# Patient Record
Sex: Female | Born: 1975 | Race: Black or African American | Hispanic: No | Marital: Single | Smoking: Never smoker
Health system: Southern US, Academic
[De-identification: ages and names within clinical notes are randomized; demographics above are authoritative.]

---
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
## 2021-08-04 IMAGING — MR MRI LUMBAR SPINE WITHOUT CONTRAST
6 series · 40 of 48 positions shown · IV contrast (gadolinium)
Comparison: None available.
COMPARISON: None available.

------------- REPORT [id number] -------------
﻿EXAM:  [id number]   MRI LUMBAR SPINE WITHOUT CONTRAST
INDICATION: Lower back pain.
TECHNIQUE: Multiplanar multisequential MRI of the lumbar spine was performed without gadolinium contrast.

[Series 5: T2 · sagittal · 4.0mm · 0.94mm/px · 5 of 13 slices shown (1 of 3)]
[im 1/13]
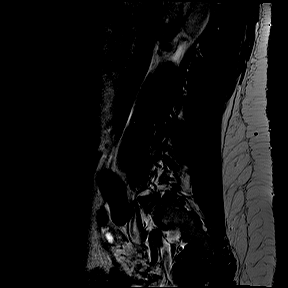
[im 4/13]
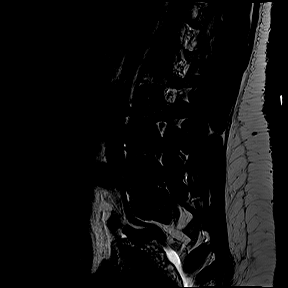
[im 7/13]
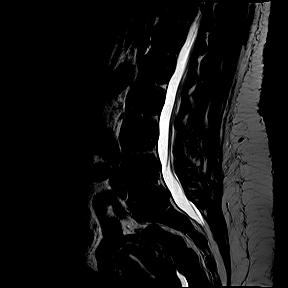
[im 10/13]
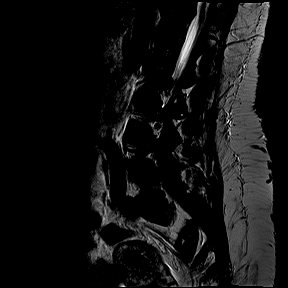
[im 13/13]
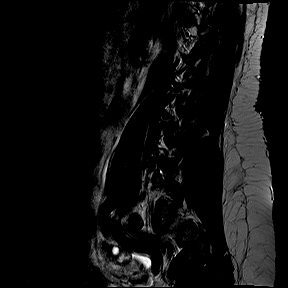

[Series 6: T1 · sagittal · 4.0mm · 0.94mm/px · 6 of 13 slices shown (1 of 2)]
[im 1/13]
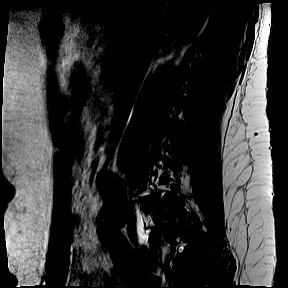
[im 3/13]
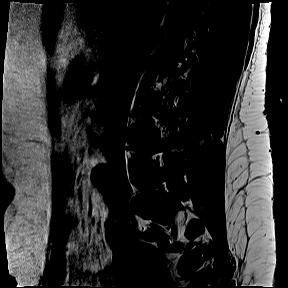
[im 5/13]
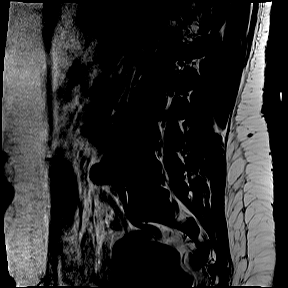
[im 8/13]
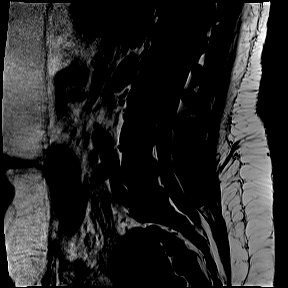
[im 10/13]
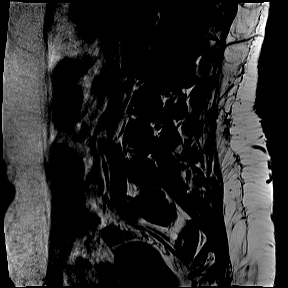
[im 13/13]
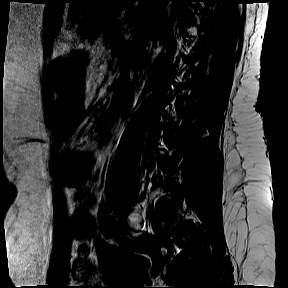

[Series 7: STIR · sagittal · 4.0mm · 1.05mm/px · 1 of 13 slices shown]
[im 1/13]
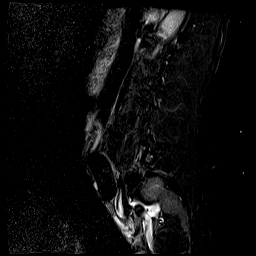

[Series 8: T2 · axial · 4.0mm · 0.47mm/px · z∈[-90,+103]mm · 11 of 23 slices shown (2 of 3)]
[im 1/23]
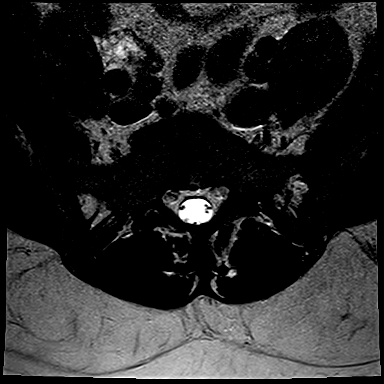
[im 3/23]
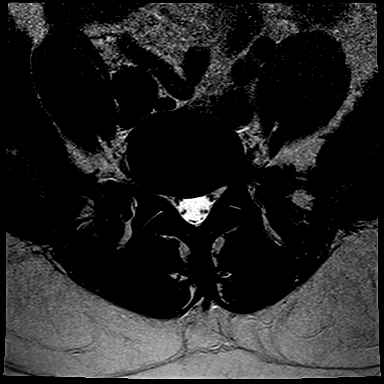
[im 5/23]
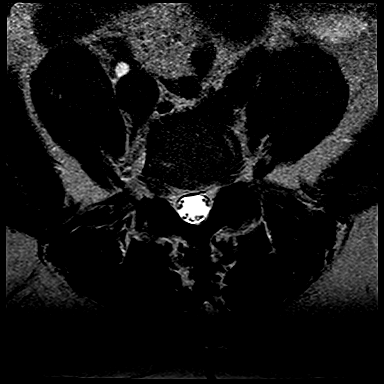
[im 7/23]
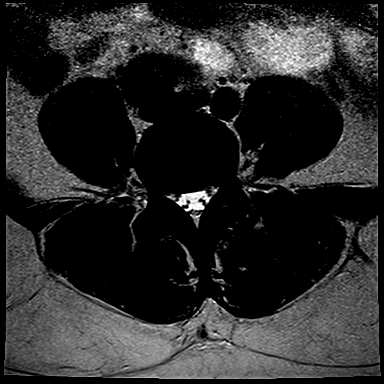
[im 9/23]
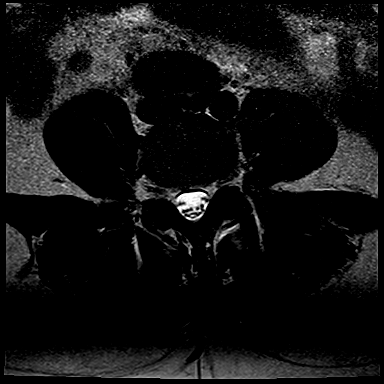
[im 12/23]
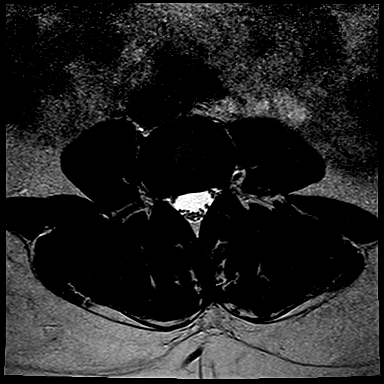
[im 14/23]
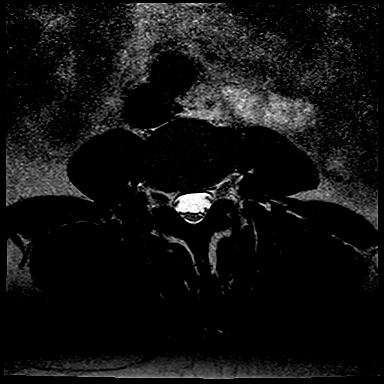
[im 16/23]
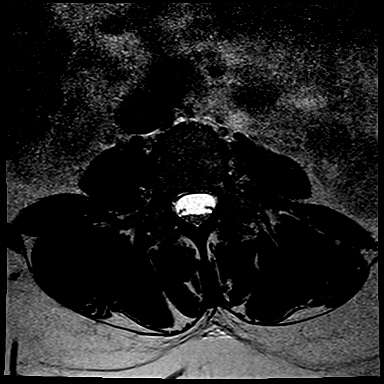
[im 18/23]
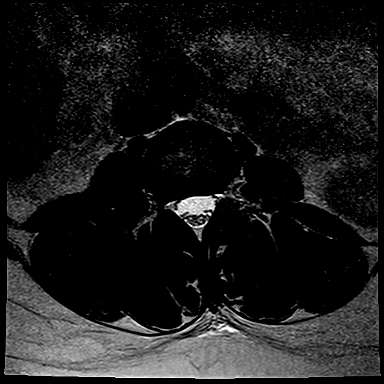
[im 20/23]
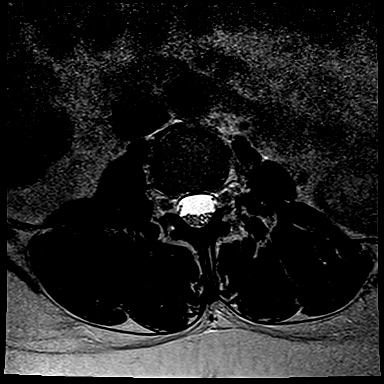
[im 23/23]
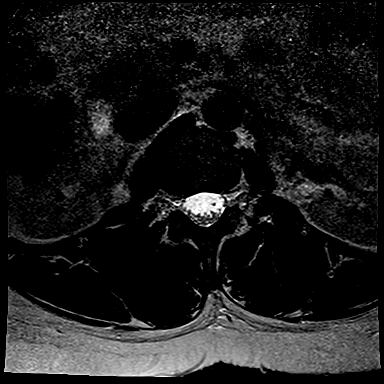

[Series 9: T1 · axial · 4.0mm · 0.47mm/px · z∈[-90,+103]mm · 8 of 23 slices shown (2 of 2)]
[im 1/23]
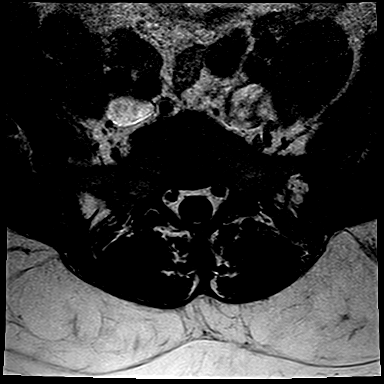
[im 5/23]
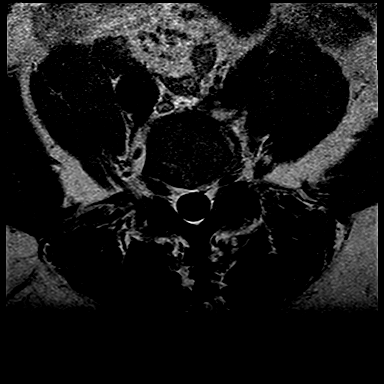
[im 7/23]
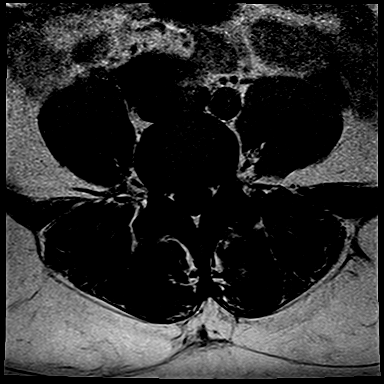
[im 9/23]
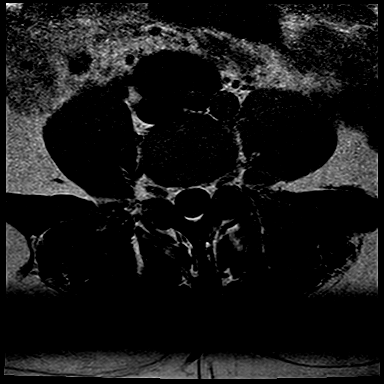
[im 14/23]
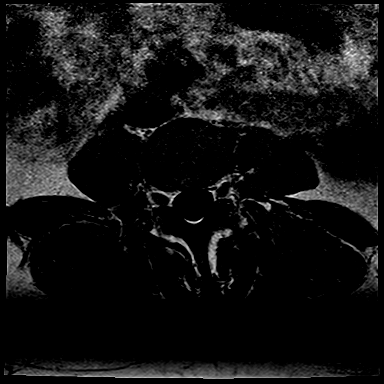
[im 16/23]
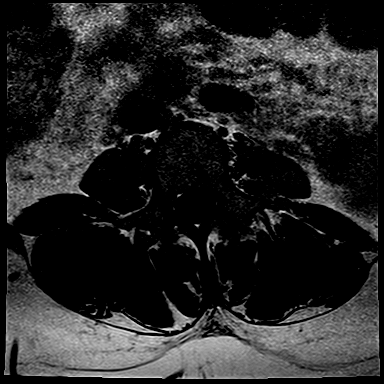
[im 18/23]
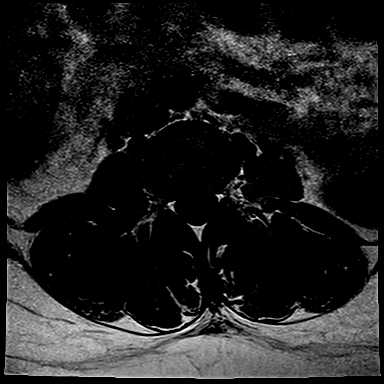
[im 23/23]
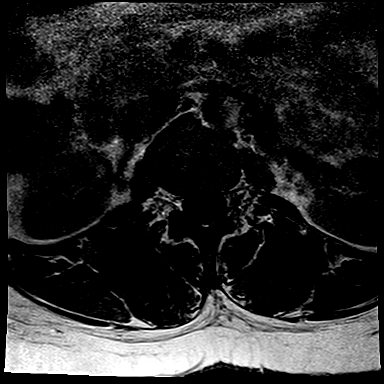

[Series 10: T2 · coronal · 4.0mm · 1.18mm/px · 9 of 20 slices shown (3 of 3)]
[im 1/20]
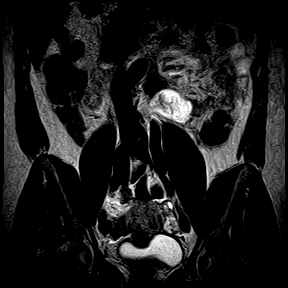
[im 3/20]
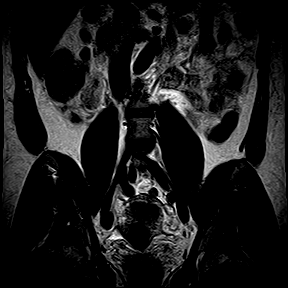
[im 5/20]
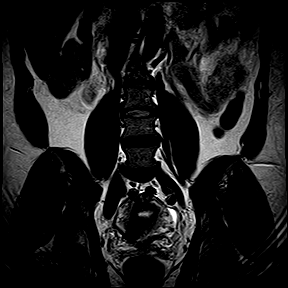
[im 8/20]
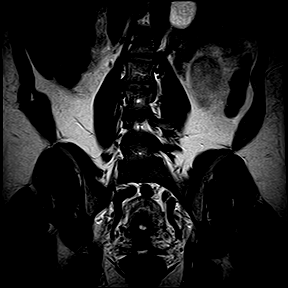
[im 10/20]
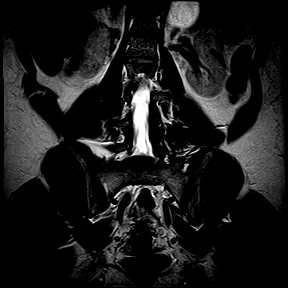
[im 12/20]
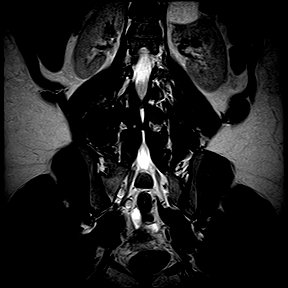
[im 15/20]
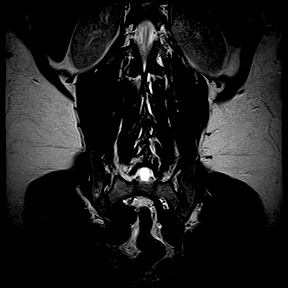
[im 17/20]
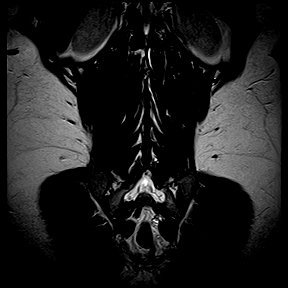
[im 20/20]
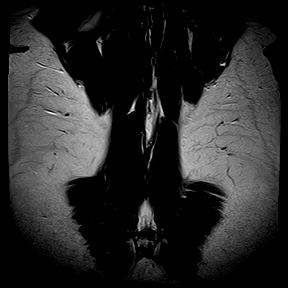

[40 of 48 positions shown; findings below may reference images not displayed]

FINDINGS: Vertebral bodies are normal in height, alignment and signal intensity.  There is no acute fracture or subluxation. Distal spinal cord is normal in signal intensity and terminates normally at T12-L1 disc space level. Spinal canal is congenitally narrow.

L1-2 and L2-3 levels are unremarkable. 

At L3-4 level, there is a minimal bulging annulus, minimally effacing the ventral thecal sac. There is mild right neural foraminal stenosis from bulging annulus without nerve root impingement. 

At L4-5 level, there is a small broad-based central disc bulge, mildly effacing the ventral thecal sac.  There is moderate bilateral neural foraminal stenosis from facet arthropathy and bulging annulus. 

At L5-S1 level, there is a small broad-based central disc bulge, mildly effacing the ventral thecal sac. There is moderate bilateral neural foraminal stenosis from facet arthropathy and bulging annulus.

Paraspinal soft tissues are unremarkable.
IMPRESSION: 1. No significant disc herniation or spinal stenosis at any level. 

2. Multilevel neural foraminal stenosis as detailed above.

------------- REPORT [id number] -------------
EXAM:  [id number]   MRI LUMBAR SPINE WITHOUT CONTRAST
FINDINGS: Vertebral bodies are normal in height, alignment and signal intensity.  There is no acute fracture or subluxation. Distal spinal cord is normal in signal intensity and terminates normally at T12-L1 disc space level. Spinal canal is congenitally narrow.

L1-2 and L2-3 levels are unremarkable. 

At L3-4 level, there is a minimal bulging annulus, minimally effacing the ventral thecal sac. There is mild right neural foraminal stenosis from bulging annulus without nerve root impingement. 

At L4-5 level, there is a small broad-based central disc bulge, mildly effacing the ventral thecal sac.  There is moderate bilateral neural foraminal stenosis from facet arthropathy and bulging annulus. 

At L5-S1 level, there is a small broad-based central disc bulge, mildly effacing the ventral thecal sac. There is moderate bilateral neural foraminal stenosis from facet arthropathy and bulging annulus.

Paraspinal soft tissues are unremarkable.
IMPRESSION: 1. No significant disc herniation or spinal stenosis at any level. 

2. Multilevel neural foraminal stenosis as detailed above.

## 2021-08-04 IMAGING — MR MRI THORACIC SPINE WITHOUT CONTRAST
4 of 5 series · 26 of 48 positions shown · IV contrast (gadolinium)
Comparison: None available.
COMPARISON: None available.

------------- REPORT [id number] -------------
﻿EXAM:  [id number]   MRI THORACIC SPINE WITHOUT CONTRAST
INDICATION: Mid back pain.
TECHNIQUE: Multiplanar multisequential MRI of the thoracic spine was performed without gadolinium contrast.

[Series 5: T2 · sagittal · 4.0mm · 0.78mm/px · 8 of 13 slices shown (1 of 2)]
[im 1/13]
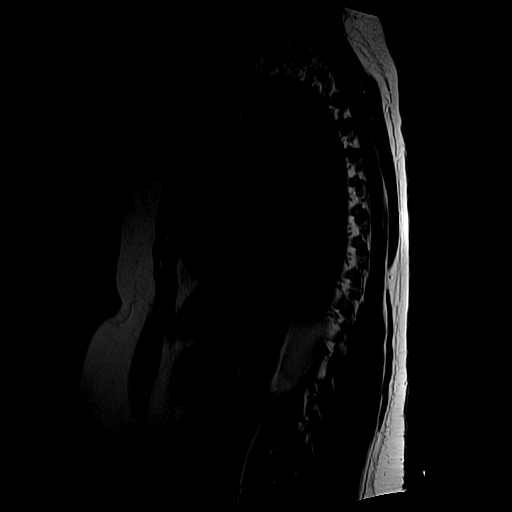
[im 2/13]
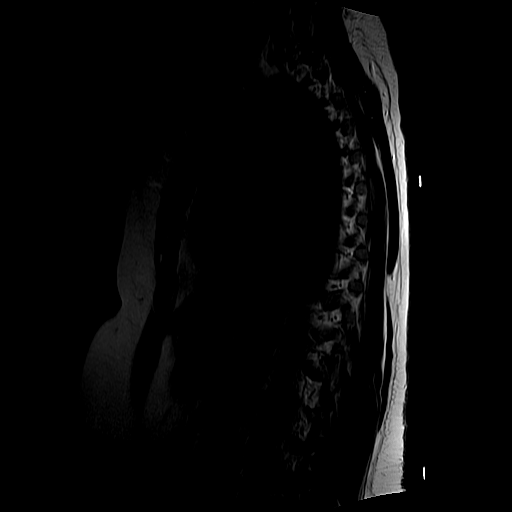
[im 5/13]
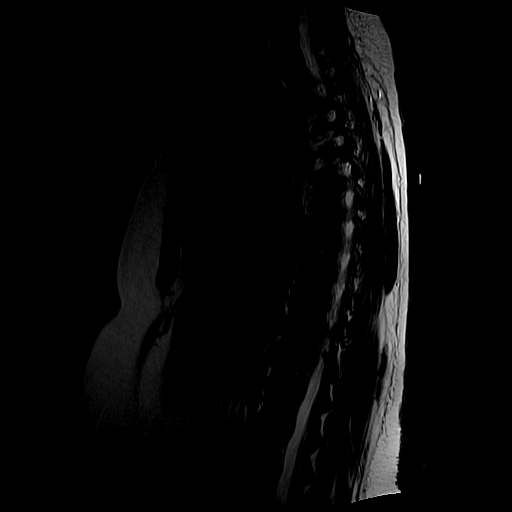
[im 6/13]
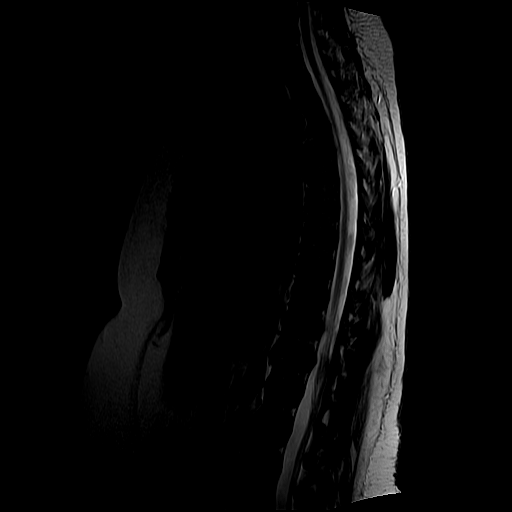
[im 7/13]
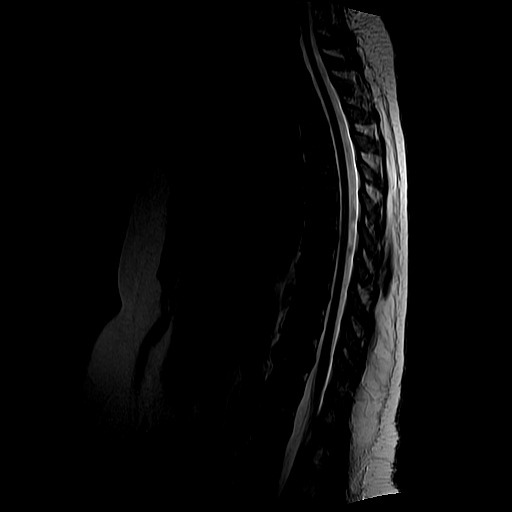
[im 9/13]
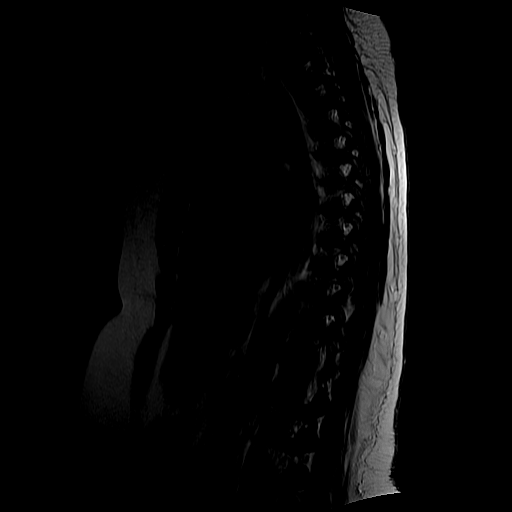
[im 11/13]
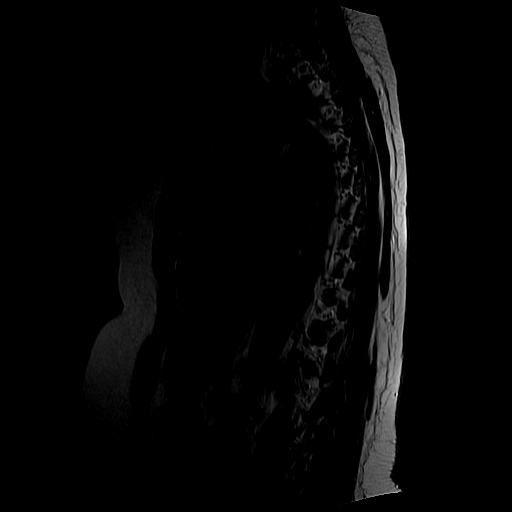
[im 13/13]
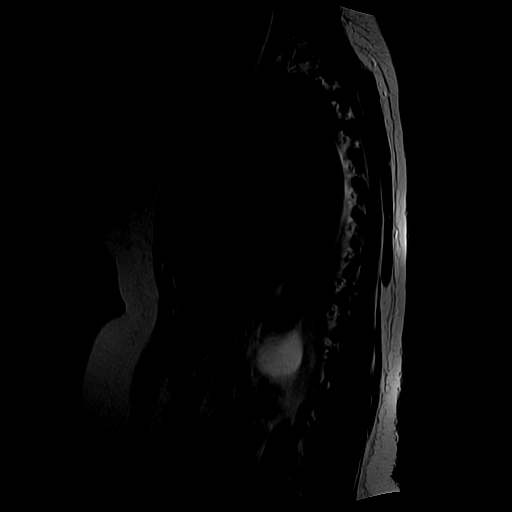

[Series 6: T1 · sagittal · 4.0mm · 0.78mm/px · 6 of 13 slices shown (1 of 2)]
[im 1/13]
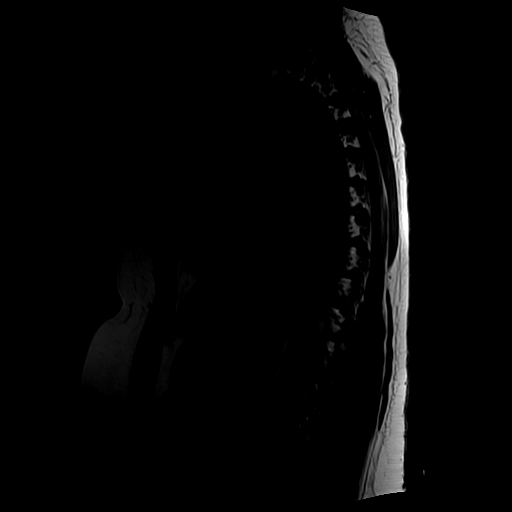
[im 2/13]
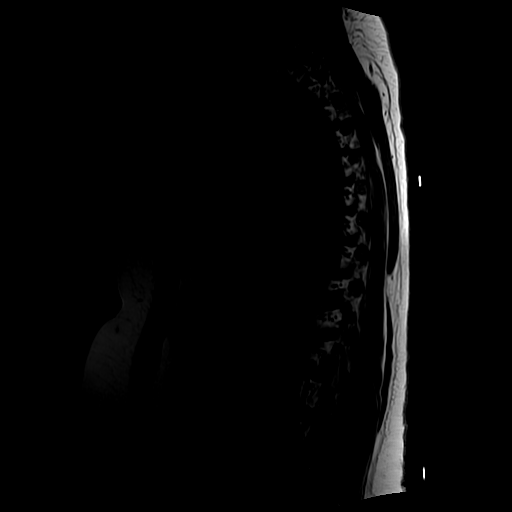
[im 5/13]
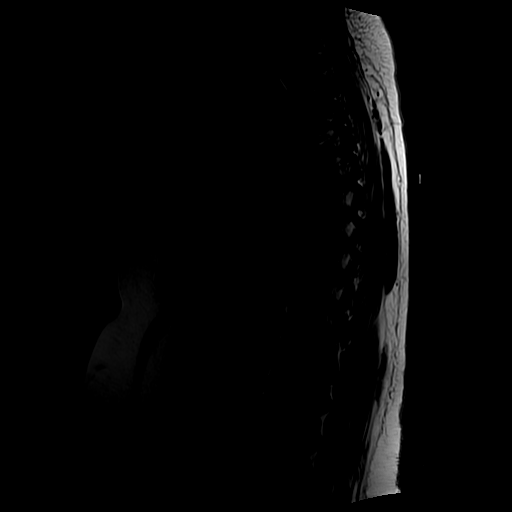
[im 6/13]
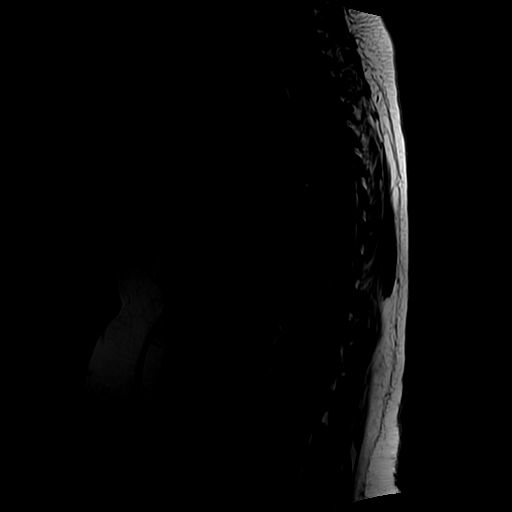
[im 7/13]
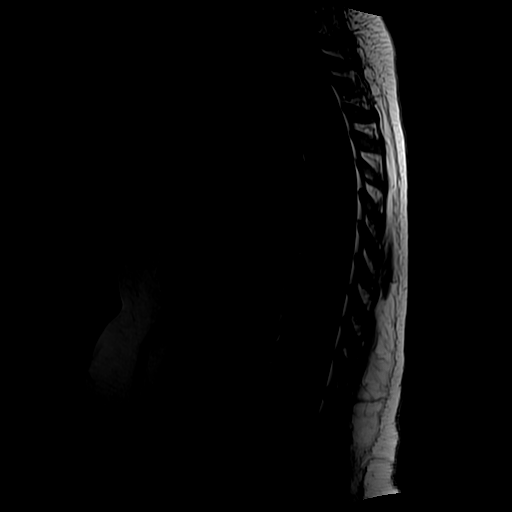
[im 11/13]
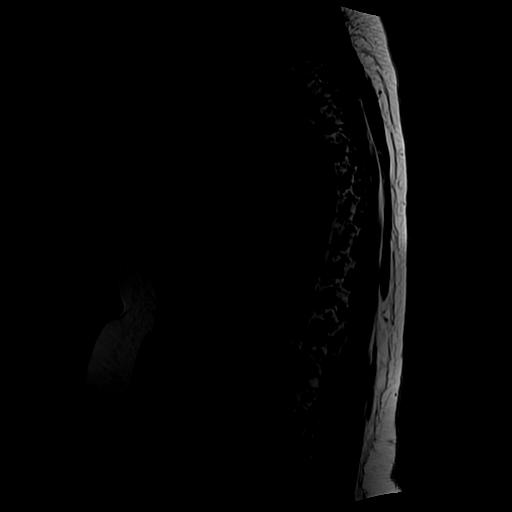

[Series 8: T2 · axial · 4.0mm · 0.62mm/px · z∈[-307,-110]mm · 9 of 12 slices shown (2 of 2)]
[im 1/12]
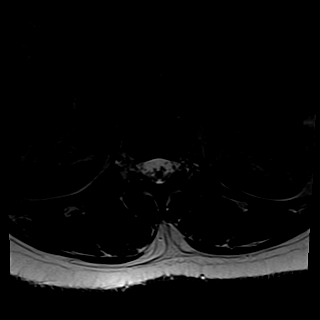
[im 2/12]
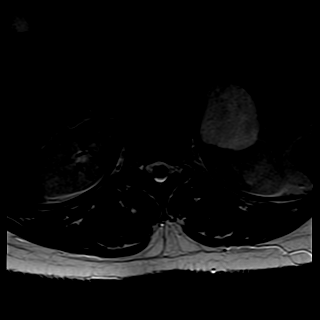
[im 3/12]
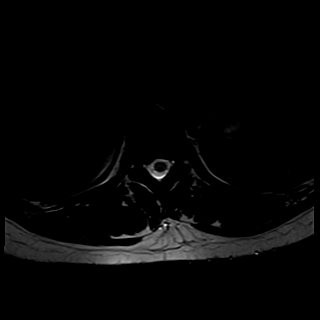
[im 5/12]
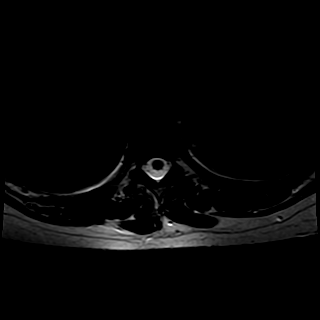
[im 6/12]
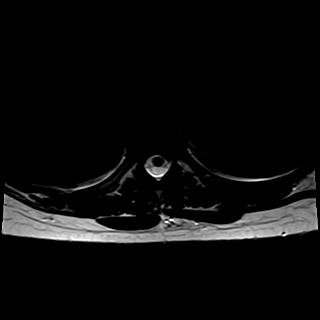
[im 7/12]
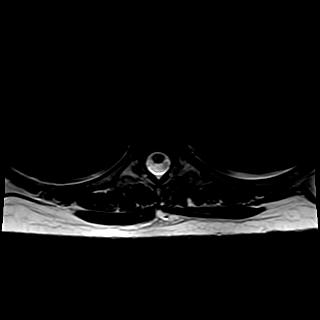
[im 9/12]
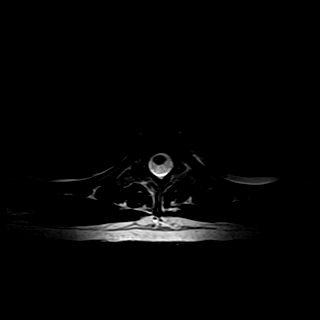
[im 10/12]
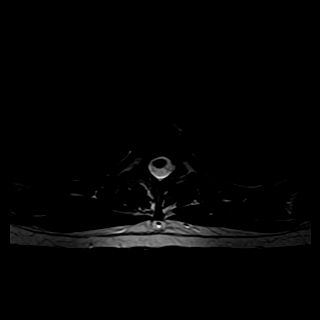
[im 12/12]
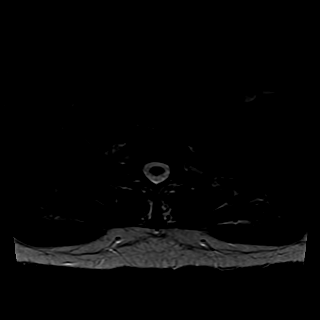

[Series 9: T1 · axial · 4.0mm · 0.62mm/px · z∈[-284,-138]mm · 3 of 12 slices shown (2 of 2)]
[im 2/12]
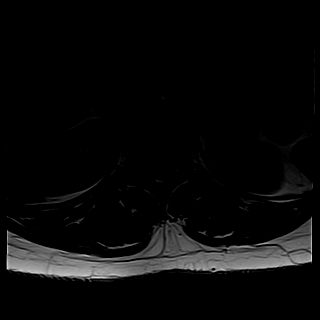
[im 6/12]
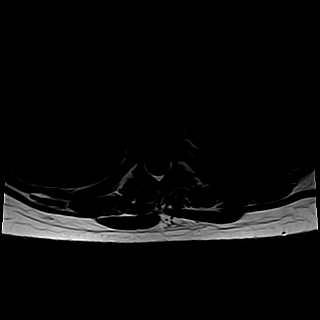
[im 10/12]
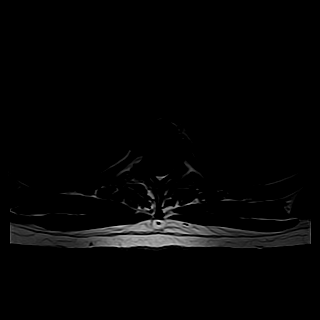

[26 of 48 positions shown; findings below may reference images not displayed]

FINDINGS: Vertebral bodies are normal in height, alignment and signal intensity. There is no acute fracture or subluxation. Visualized spinal cord is normal in signal intensity without evidence of compression at any level.

There is no significant disc herniation, spinal canal or neural foraminal stenosis at any level. 

Paraspinal soft tissues are also unremarkable.  There is no pleural effusion.
IMPRESSION: Unremarkable exam.

------------- REPORT [id number] -------------
EXAM:  [id number]   MRI THORACIC SPINE WITHOUT CONTRAST
FINDINGS: Vertebral bodies are normal in height, alignment and signal intensity. There is no acute fracture or subluxation. Visualized spinal cord is normal in signal intensity without evidence of compression at any level.

There is no significant disc herniation, spinal canal or neural foraminal stenosis at any level. 

Paraspinal soft tissues are also unremarkable.  There is no pleural effusion.
IMPRESSION: Unremarkable exam.

## 2021-09-07 ENCOUNTER — Ambulatory Visit (HOSPITAL_COMMUNITY)
Admission: RE | Admit: 2021-09-07 | Discharge: 2021-09-07 | Disposition: A | Discharge status: Still In Hospital | Payer: 59 | Source: Ambulatory Visit

## 2021-09-07 ENCOUNTER — Other Ambulatory Visit: Payer: Self-pay

## 2021-09-07 DIAGNOSIS — M25562 Pain in left knee: Secondary | ICD-10-CM | POA: Insufficient documentation

## 2021-09-07 DIAGNOSIS — M25561 Pain in right knee: Secondary | ICD-10-CM | POA: Insufficient documentation

## 2021-09-07 DIAGNOSIS — M542 Cervicalgia: Secondary | ICD-10-CM
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
## 2021-09-10 ENCOUNTER — Ambulatory Visit (HOSPITAL_COMMUNITY)
Admission: RE | Admit: 2021-09-10 | Discharge: 2021-09-10 | Disposition: A | Discharge status: Still In Hospital | Payer: 59 | Source: Ambulatory Visit

## 2021-09-10 ENCOUNTER — Other Ambulatory Visit (HOSPITAL_COMMUNITY): Payer: Self-pay | Admitting: NURSE PRACTITIONER

## 2021-09-10 ENCOUNTER — Other Ambulatory Visit: Payer: Self-pay

## 2021-09-10 DIAGNOSIS — M542 Cervicalgia: Secondary | ICD-10-CM

## 2021-09-10 DIAGNOSIS — M25562 Pain in left knee: Secondary | ICD-10-CM

## 2021-09-10 DIAGNOSIS — M25561 Pain in right knee: Secondary | ICD-10-CM

## 2021-09-10 NOTE — PT Evaluation (Signed)
[facility] Medicine [facility]  Outpatient Physical Therapy  [address]  [place], [address]  [phone]  (Fax) [phone]    Physical Therapy Treatment Note    Date: 09/10/2021  Patient's Name: Laurie Eaton  Date of Birth: May 25, 1976            Visit #/POC: 2/18  Authorization:11/02/21      Evaluating Physical Therapist: Iver Nestle, PT   PT diagnosis: Cervicalgia  Next Scheduled Physician Appointment: Julien Nordmann          Subjective: I feel a little better.  It feels a little looser    Objective: Patient continues to hold herself in a guarded position.      EXERCISE/ACTIVITY NAME REPETITIONS RESISTANCE   Miracle ball - cervical and sacral   2 cervical, 1 sacral  1 minute each    Suboccipital release    1 Release felt     Craniosacral   Flex/ext cranium and parietal lift      [place]    upper thoracic and scapula bilaterally  Mild redness suggesting increased blood flow and tissue release                                                          Assessment: Patient has tightness in the cervical, upper thoracic and scapula region.     Plan: Continue with soft tissue and progress to strength and flexibility exercise.      JOINT MOBILIZATION/MFR 32 minutes    Iver Nestle, PT  09/10/2021, 14:10

## 2021-09-11 ENCOUNTER — Encounter (HOSPITAL_COMMUNITY): Payer: Self-pay

## 2021-09-11 ENCOUNTER — Inpatient Hospital Stay
Admission: RE | Admit: 2021-09-11 | Discharge: 2021-09-11 | Disposition: A | Discharge status: Home / Self Care | Payer: 59 | Source: Ambulatory Visit | Attending: NURSE PRACTITIONER | Admitting: NURSE PRACTITIONER

## 2021-09-11 ENCOUNTER — Other Ambulatory Visit (HOSPITAL_COMMUNITY): Payer: Self-pay | Admitting: NURSE PRACTITIONER

## 2021-09-11 DIAGNOSIS — Z1231 Encounter for screening mammogram for malignant neoplasm of breast: Secondary | ICD-10-CM | POA: Insufficient documentation

## 2021-09-11 DIAGNOSIS — Z1239 Encounter for other screening for malignant neoplasm of breast: Secondary | ICD-10-CM

## 2021-09-14 ENCOUNTER — Other Ambulatory Visit: Payer: Self-pay

## 2021-09-14 ENCOUNTER — Ambulatory Visit (HOSPITAL_COMMUNITY)
Admission: RE | Admit: 2021-09-14 | Discharge: 2021-09-14 | Disposition: A | Discharge status: Still In Hospital | Payer: 59 | Source: Ambulatory Visit

## 2021-09-14 NOTE — PT Treatment (Signed)
[facility] Medicine [facility]  Outpatient Physical Therapy  [address]  [place], [address]  [phone]  (Fax) [phone]    Physical Therapy Treatment Note    Date: 09/14/2021  Patient's Name: Laurie Eaton  Date of Birth: 07-24-1975            Visit #/POC:  3/ 18  Authorization: 11/02/21      Evaluating Physical Therapist: Iver Nestle, PT  PT diagnosis: Cervicalgia  Next Scheduled Physician Appointment:  unknown          Subjective: Pt states she is doing only fair today.  Notes she worked at [employer].  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  Rates pain 6-7/10 today.  States therapy does make her feel her better.  Reports no headaches this weekend    Objective: Manual therapies and use of Miracle ball as below. Discussed posture and scapular retractions     EXERCISE/ACTIVITY NAME REPETITIONS RESISTANCE   Miracle ball - cervical and sacral   2 cervical, 1 sacral  1 minute each    Suboccipital release    1 Release felt     Craniosacral   Flex/ext cranium and parietal lift                                                  Assessment: Pt tolerated well.  Some soreness after session but states feels better than when she came in.  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  Poor postural tendencies present.  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 Work days noted to be increased pain days    Plan: Will continue and progress to more active participation as tolerated      JOINT MOBILIZATION/MFR 40 minutes    Laytoya Ion, PTA  09/14/2021, 17:49

## 2021-09-16 ENCOUNTER — Other Ambulatory Visit: Payer: Self-pay

## 2021-09-16 ENCOUNTER — Ambulatory Visit (HOSPITAL_COMMUNITY)
Admission: RE | Admit: 2021-09-16 | Discharge: 2021-09-16 | Disposition: A | Discharge status: Still In Hospital | Payer: 59 | Source: Ambulatory Visit

## 2021-09-16 NOTE — PT Treatment (Signed)
[facility] Medicine [facility]  Outpatient Physical Therapy  [address]  [place], [address]  [phone]  (Fax) [phone]    Physical Therapy Treatment Note    Date: 09/16/2021  Patient's Name: Laurie Eaton  Date of Birth: March 09, 1976            Visit #/POC: 4/18  Authorization:  11/02/21      Evaluating Physical Therapist: Iver Nestle, PT  PT diagnosis: Cervicalgia  Next Scheduled Physician Appointment: Unknown          Subjective: Pt reports she is much better today.  Rates pain 4/10.  Notes improvement since last session.  States she has worked past 2 days just not picking up kids.  Notes minimal headache yesterday and none today.  Feels like therapy is helping    Objective: Manual techniques along with exercise as noted below.  Again discussed posture and importance of being able to achieve upright posture several times during her work day.      EXERCISE/ACTIVITY NAME REPETITIONS RESISTANCE   Miracle ball - cervical and sacral   2 cervical, 1 sacral  1 minute each    Suboccipital release    1 Release felt     Upper trap stretch    manual   Gentle P/A glide upper cervical                                      Assessment: Pt tolerated well.  Responding nicely to current treatment.  Noted improved subjective complaints.      Plan: Will continue and progress to more active program as tolerated      THERAPEUTIC EXERCISE 15 minutes and JOINT MOBILIZATION/MFR 25 minutes    Auguste Tebbetts, PTA  09/16/2021, 17:51

## 2021-09-21 ENCOUNTER — Ambulatory Visit (HOSPITAL_COMMUNITY)
Admission: RE | Admit: 2021-09-21 | Discharge: 2021-09-21 | Disposition: A | Discharge status: Still In Hospital | Payer: 59 | Source: Ambulatory Visit

## 2021-09-21 ENCOUNTER — Other Ambulatory Visit: Payer: Self-pay

## 2021-09-21 NOTE — PT Treatment (Signed)
[facility] Medicine [facility]  Outpatient Physical Therapy  [address]  [place], [address]  [phone]  (Fax) [phone]    Physical Therapy Treatment Note    Date: 09/21/2021  Patient's Name: Laurie Eaton  Date of Birth: [birth year]            Visit #/POC:  4/18  Authorization:  11/02/21      Evaluating Physical Therapist: Iver Nestle, PT  PT diagnosis: cervicalgia  Next Scheduled Physician Appointment: Unknown          Subjective: Pt reports she is feeling better.  States having less of the stabbing pain.  Reports work is going better.  Notes she is doing stretches at home and is trying to move more.  Reports still feels tight but a little better    Objective: Manual activities/exercise as noted below.   EXERCISE/ACTIVITY NAME REPETITIONS RESISTANCE   Seated cervical AROM    x10     Suboccipital release     Release felt     Upper trap stretch    manual   Gentle P/A glide upper cervical       Prone cervical retraction   10    prone P/A upper thoracic/cspine                        Assessment: Tolerated well.  Pt still with some limitation but improving.  She has reduced headache and improved pain control.      Plan:   Will begin postural strengthening next visit      THERAPEUTIC EXERCISE 10 minutes and JOINT MOBILIZATION/MFR 28 minutes    Donnamaria Shands, PTA  09/21/2021, 18:12

## 2021-09-23 ENCOUNTER — Other Ambulatory Visit: Payer: Self-pay

## 2021-09-23 ENCOUNTER — Emergency Department
Admission: EM | Admit: 2021-09-23 | Discharge: 2021-09-23 | Disposition: A | Discharge status: Home / Self Care | Payer: 59 | Attending: Family Medicine | Admitting: Family Medicine

## 2021-09-23 ENCOUNTER — Encounter (HOSPITAL_BASED_OUTPATIENT_CLINIC_OR_DEPARTMENT_OTHER): Payer: Self-pay

## 2021-09-23 ENCOUNTER — Emergency Department (EMERGENCY_DEPARTMENT_HOSPITAL): Payer: 59

## 2021-09-23 ENCOUNTER — Ambulatory Visit (HOSPITAL_COMMUNITY)
Admission: RE | Admit: 2021-09-23 | Discharge: 2021-09-23 | Disposition: A | Discharge status: Still In Hospital | Payer: 59 | Source: Ambulatory Visit

## 2021-09-23 DIAGNOSIS — M25571 Pain in right ankle and joints of right foot: Secondary | ICD-10-CM

## 2021-09-23 DIAGNOSIS — S93409A Sprain of unspecified ligament of unspecified ankle, initial encounter: Secondary | ICD-10-CM | POA: Insufficient documentation

## 2021-09-23 DIAGNOSIS — Y9356 Activity, jumping rope: Secondary | ICD-10-CM | POA: Insufficient documentation

## 2021-09-23 MED ORDER — DICLOFENAC SODIUM 75 MG TABLET,DELAYED RELEASE
75.0000 mg | DELAYED_RELEASE_TABLET | Freq: Two times a day (BID) | ORAL | 0 refills | Status: AC
Start: 2021-09-23 — End: 2021-10-03

## 2021-09-23 NOTE — ED Provider Notes (Signed)
[facility] Emergency Department  ED Primary Provider Note  History of Present Illness   Chief Complaint   Patient presents with   . Foot Pain     Laurie Eaton is a 46 y.o. female who had concerns including Foot Pain.  Arrival: The patient arrived by Car    46 year old female patient presents with chief complaint right ankle pain with minimal swelling x2 weeks.  Patient knows the onset of the pain was after obtaining a boot camp and doing a jump rope.  Patient notes medial and lateral tenderness with palpation.  Distal CMS check intact CRT less than 2 seconds.  No crepitus with range of motion.  Patient does report pain with weight-bearing.        Review of Systems   Pertinent positive and negative ROS as per HPI.  Historical Data   History Reviewed This Encounter: Medical History  Surgical History  Family History  Social History      Physical Exam   ED Triage Vitals [09/23/21 1124]   BP (Non-Invasive) (!) 145/83   Heart Rate 74   Respiratory Rate 18   Temperature 35.9 C (96.7 F)   SpO2 99 %   Weight 103 kg (228 lb)   Height 1.702 m (5\' 7" )     Physical Exam  Musculoskeletal:        Feet:         Const: No acute distress, active  HENT:  Normocephalic, left tympanic membrane normal, right tympanic membrane normal, external nose normal nares normal, face is symmetrical, Throat/ posterior oral pharynx normal, mouth pink and moist  EYES:  Pupils perrl  Neck:  Normal visual inspection, full range of motion, no lymphadenopathy  Chest: Normal visual inspection, no tenderness  Resp:  Normal respiratory effort, no audible wheezes and no cough, clear to auscultation bilaterally  Cardio:  In regular rate and rhythm, S1 noted  GI:  Normal visual inspection soft to palpation  Musculoskeletal:  No CVA tenderness, normal range of motion, minimal swelling, medial and lateral tenderness right ankle  Derm:  No rashes or lesions noted, turgor normal.  No obvious wound noted  Neuro:   Patient awake oriented x3 moves all extremities  Psych:  Mental status is normal, speech and movement normal, mood appropriate for age and situation  Patient Data   Labs Ordered/Reviewed - No data to display  XR ANKLE RIGHT   Preliminary Result by Edi, Radresults In (03/22 1303)   No acute fracture right ankle is identified.               Radiologist location ID: [id number]           Medical Decision Making        Medical Decision Making  X-rays reviewed noting no acute fracture.  Patient was placed in a splint and provided a prescription for diclofenac did discuss the need to follow-up with her primary care provider in [place] and this continued as she may need an outpatient MRI.  Patient voiced understanding.    Amount and/or Complexity of Data Reviewed  Radiology: ordered.      Risk  Prescription drug management.        ED Course as of 09/23/21 1314   Wed Sep 23, 2021   1309     FINDINGS: 3 views of the right ankle were obtained. No acute fracture is identified. Is mild soft tissue prominence throughout the ankle. The visualized joint spaces have a  normal appearance on these nonweightbearing views.          IMPRESSION:  No acute fracture right ankle is identified.                Clinical Impression   Ankle sprain (Primary)       Disposition: Discharged

## 2021-09-23 NOTE — Discharge Instructions (Signed)
Thank you for allowing us to be part of your care.    Please discuss all medications with her pharmacist.  You may have received sedating medications during your visit, please discuss this with your discharging nurse as you may not be able to drive or operate machinery with this medication in your system.  If you feel your situation worsens or does not get better please see a physician for re-evaluation.

## 2021-09-23 NOTE — PT Treatment (Signed)
[name]  Outpatient Physical Therapy  [address]  [place], [address]  [phone]  (Fax) [phone]    Physical Therapy Treatment Note    Date: 09/23/2021  Patient's Name: Laurie Eaton  Date of Birth: August 23, 1975            Visit #/POC:  5 / 18  Authorization:       Evaluating Physical Therapist: Iver Nestle, PT  PT diagnosis: Cervicalgia  Next Scheduled Physician Appointment: unknown            Subjective: Pt reports doing good.  States she is feeling better.  Had to carry a child at work today.  States it was not as bad as last week. No headache noted.  Still feels stiff but improving.  Is working on posture as advised     Objective: Actives as noted below    EXERCISE/ACTIVITY NAME REPETITIONS RESISTANCE   Seated cervical AROM    x10     Suboccipital release     Release felt     Upper trap stretch    manual   Scalene, SCM release    manual   Prone cervical retraction   10    prone P/A upper thoracic/cspine                        Assessment:   Pt tolerated treatment well.  She has improved mobility overall.  Soft tissue restriction noted but improved.  Poor postural tendencies and postural weakness are contributing factors.      Plan:   Hope to begin postural strengthening next visit      JOINT MOBILIZATION/MFR 30 minutes    Ukiah Trawick, PTA  09/23/2021, 17:48

## 2021-09-23 NOTE — ED Triage Notes (Signed)
States that about two weeks ago she did a boot camp class and ever since then her right foot has been hurting, shooting pain into right leg. States that it hurts to move foot a certain way, hurts to drive.

## 2021-09-23 NOTE — ED Nurses Note (Signed)
Ice pack applied to right ankle at this time.

## 2021-09-28 ENCOUNTER — Other Ambulatory Visit: Payer: Self-pay

## 2021-09-28 ENCOUNTER — Ambulatory Visit (HOSPITAL_COMMUNITY)
Admission: RE | Admit: 2021-09-28 | Discharge: 2021-09-28 | Disposition: A | Discharge status: Still In Hospital | Payer: 59 | Source: Ambulatory Visit

## 2021-09-28 NOTE — PT Treatment (Signed)
[facility] Medicine [facility]  Outpatient Physical Therapy  [address]  [place], [address]  [phone]  (Fax) [phone]    Physical Therapy Treatment Note    Date: 09/28/2021  Patient's Name: Laurie Eaton  Date of Birth: 09/15/1975            Visit #/POC:  6/ 18  Authorization:       Evaluating Physical Therapist: Iver Nestle, PT  PT diagnosis: Cervicalgia  Next Scheduled Physician Appointment: unknown            Subjective: I am feeling less tight.  The pinching in my shoulder blade came back a little, but now it is better again   Objective: Patient is holding her head sidebent to the left.     EXERCISE/ACTIVITY NAME REPETITIONS RESISTANCE   Seated cervical AROM       [place] scapula area  Histamine rxn suprspinatus and infraspinatus, rhomboid/middle trap   Suboccipital release     Release felt     Upper trap stretch    manual   Scalene, SCM release    manual   Prone cervical retraction       prone P/A upper thoracic/cspine         Yoga stretch- Palm up elbows bend, move arms back with trunk and cervical extension; bent arms forward touch elbows fingers to the sky  6              Assessment:  Patient able to hold neck in neutral alignment at the end of treatment.    Plan:   Hope to begin postural strengthening next visit        JOINT MOBILIZATION/MFR 40 minutes    Iver Nestle, PT  09/28/2021, 08:07

## 2021-09-30 ENCOUNTER — Ambulatory Visit
Admission: RE | Admit: 2021-09-30 | Discharge: 2021-09-30 | Disposition: A | Discharge status: Still In Hospital | Payer: 59 | Source: Ambulatory Visit | Attending: NURSE PRACTITIONER | Admitting: NURSE PRACTITIONER

## 2021-09-30 ENCOUNTER — Other Ambulatory Visit: Payer: Self-pay

## 2021-09-30 NOTE — PT Treatment (Signed)
[facility] Medicine [facility]  Outpatient Physical Therapy  [address]  [place], [address]  [phone]  (Fax) [phone]    Physical Therapy Treatment Note    Date: 09/30/2021  Patient's Name: Laurie Eaton  Date of Birth: 11/03/1975            Visit #/POC: 7/ 18  Authorization: 20      Evaluating Physical Therapist: Iver Nestle PT  PT diagnosis/Reason for Referral: Cervicalgia  Next Scheduled Physician Appointment: Will see neurologist sometime end of April          Subjective: Pt states she feels better but not all the way better.  States about 50% better. Pain decreased but still feels tight.  Sleeping better most nights but randomly has nights that are uncomfortable     Objective:  Activities and exercise noted below.  Additions to HEP including handouts and tband   exercise   reps  resistance   Seated cervical AROM       [place] scapula area  Histamine rxn suprspinatus and infraspinatus, rhomboid/middle trap   Trunk/cervical extension over ball (arms crossed)    10 x  physioball     Upper trap stretch prone    manual   Seated side glide arm on ball   10x  physioball   Prone cervical retraction   10x    prone P/A upper thoracic/cspine    manual    supine shoulder diagonals, horizontal abduction, scapular retraction  10x  red          Yoga stretch- Palm up elbows bend, move arms back with trunk and cervical extension; bent arms forward touch elbows fingers to the sky  6                    Assessment: Pt tolerated treatment well.  She fatigues very quickly with strengthening exercises.  She has poor postural tendencies and postural weakness that contribute to pain.  Pt is progressing however with improved subjective complaints    Plan: Will monitor effects of new exercise and proceed accordingly      Exercise x 25 min, MFR x 15 min    Tyton Abdallah, PTA  09/30/2021, 16:24

## 2021-10-07 ENCOUNTER — Other Ambulatory Visit: Payer: Self-pay

## 2021-10-07 ENCOUNTER — Ambulatory Visit (HOSPITAL_COMMUNITY)
Admission: RE | Admit: 2021-10-07 | Discharge: 2021-10-07 | Disposition: A | Discharge status: Still In Hospital | Payer: 59 | Source: Ambulatory Visit

## 2021-10-07 DIAGNOSIS — M542 Cervicalgia: Secondary | ICD-10-CM | POA: Insufficient documentation

## 2021-10-07 DIAGNOSIS — M25562 Pain in left knee: Secondary | ICD-10-CM | POA: Insufficient documentation

## 2021-10-07 DIAGNOSIS — M25561 Pain in right knee: Secondary | ICD-10-CM | POA: Insufficient documentation

## 2021-10-07 NOTE — PT Treatment (Signed)
[facility]  Outpatient Physical Therapy  [place], [address]  [phone]  [phone]    Physical Therapy Treatment Note    Date: 10/07/2021  Patient's Name: Laurie Eaton  Date of Birth: Apr 17, 1976            Visit #/POC: 9 of 33 ;  11/02/21  Authorization:      Evaluating Physical Therapist: Wyatt Mage PT  PT diagnosis/Reason for Referral: Cervicalgia  Next Scheduled Physician Appointment: April          Subjective: Pt feels about 60% better overall.  Notes no longer having constant pain.  Does feel like she has lost some strength.  Reports tightness at end of ranges of motion.  States she is doing exercise at home as advised.  5/10 pain when first gets up in the mornings.  Loosens up throughout the day.      Objective: Warm up today on UBE followed by exercise/activities below.        EXERCISE/ACTIVITY NAME REPETITIONS RESISTANCE COMPLETED THIS DOS   UBE   5 min 70 rpm yes   Thoracic/cervical extension over ball (arms crossed   15 na yes   Wall angel   10 na yes   Supine horizontal abduction/diagonals   15 each green yes   Supine scapula retraction   15 green yes   MFR: suboccipital, upper trap, levator scap bilaterally, upper thoracic mobs   15 min manual yes                                 Assessment: Pt tolerated well.  She has made gains since starting therapy.  However she continues to have tightness and morning pain.  Pt reports compliance with HEP and notes improved sleep, pain tolerance, and activity. Pt has long history of L knee pain/limted motion.  This has worsened in past few months.  It contributes to her uneven posture in standing postions    Plan: Will continue and progress as tolerated      Short Term Goals: 3 Weeks  -Independent home program. ROM / Stabilization / Self care / Activity modification.               -Knowledge of proper body mechanics.               -Decrease in pain. Intermittent vs. constant. Worst SPS rating less  than 4.               -Increase ROM / Strength  -Improve function. Decreased sleep disruption / Reduce HAS / Reduce radicular symptoms. MET                     Long Term Goals: 4 Weeks               -Abolish radicular symptoms. Worst SPS rating less than 2.  -Increase ROM / Strength to [facility] to allow normal body mechanics with all mobility.  -Improve function. Restorative sleep / Abolish HAS / tolerance of static posture not limited by pain.               -Restore patients ability to perform her job without pain.                -Patient is able to return to performing regular gyn workout      Total Session Time 35  and Timed code minutes 35  THERAPEUTIC EXERCISE 20 minutes and JOINT MOBILIZATION/MFR 15 minutes      Clint Biello, PTA  10/07/2021, 17:10

## 2021-10-12 ENCOUNTER — Ambulatory Visit (HOSPITAL_COMMUNITY)
Admission: RE | Admit: 2021-10-12 | Discharge: 2021-10-12 | Disposition: A | Discharge status: Still In Hospital | Payer: 59 | Source: Ambulatory Visit

## 2021-10-12 ENCOUNTER — Other Ambulatory Visit: Payer: Self-pay

## 2021-10-12 NOTE — PT Treatment (Signed)
[facility]  Outpatient Physical Therapy  [place], [address]  [phone]  [phone]    Physical Therapy Treatment Note    Date: 10/12/2021  Patient's Name: Laurie Eaton  Date of Birth: December 21, 1975            Visit #/POC:  65 of 75 ;  11/02/21  Authorization:      Evaluating Physical Therapist: Wyatt Mage PT  PT diagnosis/Reason for Referral: Cervicalgia  Next Scheduled Physician Appointment: April          Subjective: The pain is better. Less pain and tightness.  Now that pinching is gone. I might have it intermittently but not often.  I am not as tight.     Objective:         EXERCISE/ACTIVITY NAME REPETITIONS RESISTANCE COMPLETED THIS DOS   UBE   5 min 70 rpm     Thoracic/cervical extension over ball (arms crossed   15 Na     Wall angel   10 Na     Supine horizontal abduction/diagonals   15 each green     Supine scapula retraction   15 green     MFR: suboccipital, upper trap, levator scap bilaterally, upper thoracic mobs   15 min manual       LTR 1 5 minutes with props yes     Upper trunk rotation 1 each side 5 minutes with props  Yes       Leg Over 1 each side  5 minutes with props yes         Assessment: Patient perceived mor openness in her upper body after treatment      Plan: Will continue and progress as tolerated      Short Term Goals: 3 Weeks  -Independent home program. ROM / Stabilization / Self care / Activity modification.               -Knowledge of proper body mechanics.               -Decrease in pain. Intermittent vs. constant. Worst SPS rating less than 4.               -Increase ROM / Strength  -Improve function. Decreased sleep disruption / Reduce HAS / Reduce radicular symptoms. MET                     Long Term Goals: 4 Weeks               -Abolish radicular symptoms. Worst SPS rating less than a 2  -Increase ROM / Strength to [facility] to allow normal body mechanics with all mobility.  -Improve function. Restorative sleep /  Abolish HAS / tolerance of static posture not limited by pain.               -Restore patients ability to perform her job without pain.                -Patient is able to return to performing regular gyn workout      Total Time 44  Therapeutic exercise 44      Leisha Truitt, PTA  10/07/2021, 17:10

## 2021-10-14 ENCOUNTER — Inpatient Hospital Stay
Admission: RE | Admit: 2021-10-14 | Discharge: 2021-10-14 | Disposition: A | Discharge status: Home / Self Care | Payer: 59 | Source: Ambulatory Visit | Attending: NURSE PRACTITIONER | Admitting: NURSE PRACTITIONER

## 2021-10-14 ENCOUNTER — Inpatient Hospital Stay (HOSPITAL_COMMUNITY)
Admission: RE | Admit: 2021-10-14 | Discharge: 2021-10-14 | Disposition: A | Discharge status: Home / Self Care | Payer: 59 | Source: Ambulatory Visit

## 2021-10-14 ENCOUNTER — Other Ambulatory Visit: Payer: Self-pay

## 2021-10-14 ENCOUNTER — Other Ambulatory Visit (HOSPITAL_COMMUNITY): Payer: Self-pay | Admitting: NURSE PRACTITIONER

## 2021-10-14 ENCOUNTER — Ambulatory Visit (HOSPITAL_COMMUNITY)
Admission: RE | Admit: 2021-10-14 | Discharge: 2021-10-14 | Disposition: A | Discharge status: Still In Hospital | Payer: 59 | Source: Ambulatory Visit

## 2021-10-14 DIAGNOSIS — M79673 Pain in unspecified foot: Secondary | ICD-10-CM

## 2021-10-14 NOTE — PT Treatment (Signed)
[facility]  Outpatient Physical Therapy  [place], [address]  [phone]  [phone]    Physical Therapy Treatment Note    Date: 10/14/2021  Patient's Name: Laurie Eaton  Date of Birth: 1975/10/16            Visit #/POC:  11/18 ;  5/ 1/ 23  Authorization:      Evaluating Physical Therapist: Wyatt Mage PT   PT diagnosis/Reason for Referral: cervicalgia  Next Scheduled Physician Appointment:  Was today          Subjective: Pt states doctor was pleased with progress on her neck.  States her neck is much better overall.  Still feels some tightness but notes not as painful and not as stiff.  Pt reports her knee is much more painful than her neck.  Pt does report that doctor did write an order for her knee.  Pt advised will have to finish treatment on her neck first    Objective: Activities as noted below.  Worked on prolonged stretch in propped positions    EXERCISE/ACTIVITY NAME REPETITIONS RESISTANCE COMPLETED THIS DOS   UBE   5 min 70 rpm  no   Thoracic/cervical extension over ball (arms crossed   15 Na  no   Wall angel   10 Na  no   Supine horizontal abduction/diagonals   15 each green  no   Supine scapula retraction   15 green  no   MFR: suboccipital, upper trap, levator scap bilaterally, upper thoracic mobs   15 min manual  no     LTR 1 5 minutes with props yes     Upper trunk rotation 1 each side 5 minutes with props  Yes       Leg Over 1 each side  5 minutes with props yes             Assessment: Pt tolerated well and notes reduction of tight feeling after session.  Pt continues to have some limitation in ROM but steady improvement noted.  Altered gait and body mechanics more limited by L knee    Plan: Will continue and progress as tolerated    Short Term Goals:3Weeks  -Independent home program. ROM / Stabilization / Self care / Activity modification.  -Knowledge of proper body mechanics.  -Decrease in  pain. Intermittent vs. constant. Worst SPS rating less than 4.  -Increase ROM / Strength  -Improve function. Decreased sleep disruption / Reduce HAS / Reduce radicular symptoms. MET        Long Term Goals:4Weeks  -Abolish radicular symptoms. Worst SPS rating less than a 2  -Increase ROM / Strength to [facility] to allow normal body mechanics with all mobility.  -Improve function. Restorative sleep / Abolish HAS / tolerance of static posture not limited by pain.  -Restore patients ability to perform her job without pain.   -Patient is able to return to performing regular gyn workout        Total Session Time 35 and Timed code minutes 35  THERAPEUTIC EXERCISE 35 minutes      [place] Welcome, PTA  10/14/2021, 17:50

## 2021-10-15 ENCOUNTER — Other Ambulatory Visit (HOSPITAL_COMMUNITY): Payer: Self-pay | Admitting: NURSE PRACTITIONER

## 2021-10-15 DIAGNOSIS — M25571 Pain in right ankle and joints of right foot: Secondary | ICD-10-CM

## 2021-10-19 ENCOUNTER — Other Ambulatory Visit: Payer: Self-pay

## 2021-10-19 ENCOUNTER — Ambulatory Visit (HOSPITAL_COMMUNITY)
Admission: RE | Admit: 2021-10-19 | Discharge: 2021-10-19 | Disposition: A | Discharge status: Still In Hospital | Payer: 59 | Source: Ambulatory Visit

## 2021-10-19 ENCOUNTER — Other Ambulatory Visit (HOSPITAL_COMMUNITY): Payer: Self-pay | Admitting: NURSE PRACTITIONER

## 2021-10-19 DIAGNOSIS — M25562 Pain in left knee: Secondary | ICD-10-CM

## 2021-10-19 NOTE — PT Treatment (Addendum)
[facility]  Outpatient Physical Therapy  [place], [address]  [phone]  [phone]    Physical Therapy Treatment Note    Date: 10/19/2021  Patient's Name: Laurie Eaton  Date of Birth: [birth year]            Visit #/POC:  11/18 ;  5/ 1/ 23  Authorization:      Evaluating Physical Therapist: Wyatt Mage PT   PT diagnosis/Reason for Referral: cervicalgia  Next Scheduled Physician Appointment:  Was today          Subjective: Pt states doctor was pleased with progress on her neck.  States her neck is much better overall.  Still feels some tightness but notes not as painful and not as stiff.  Pt reports her knee is much more painful than her neck.  Pt does report that doctor did write an order for her knee.  Pt advised will have to finish treatment on her neck first    Patient-Specific Functional Score:    Problem Score   1. Sitting on floor with kids 7   2. Dancing and playing with kids 6   3. Working out at gym 0       Objective: Activities as noted below.  Worked on prolonged stretch in propped positions    EXERCISE/ACTIVITY NAME REPETITIONS RESISTANCE COMPLETED THIS DOS   UBE   5 min 70 rpm no   Thoracic/cervical extension over ball (arms crossed   15 Na no   Wall angel   10 Na no   Supine horizontal abduction/diagonals   15 each green no   Supine scapula retraction   15 green no   MFR: suboccipital, upper trap, levator scap bilaterally, upper thoracic mobs   15 min manual no     LTR 1 5 minutes with props yes     Upper trunk rotation 1 each side 5 minutes with props  Yes       Leg Over 1 each side  5 minutes with props yes             Assessment: Pt tolerated well and notes reduction of tight feeling after session.  Pt continues to have some limitation in ROM but steady improvement noted.  Altered gait and body mechanics more limited by L knee    Plan:  DC therapy     Short Term Goals:3Weeks  -Independent home  program. ROM / Stabilization / Self care / Activity modification. MET  -Knowledge of proper body mechanics.  MET  -Decrease in pain. Intermittent vs. constant. Worst SPS rating less than 4.  Not Met    -Increase ROM / Strength \-Improve function. Decreased sleep disruption / Reduce HAS / Reduce radicular symptoms. MET        Long Term Goals:4Weeks  -Abolish radicular symptoms. Worst SPS rating less than a 2 Met  -Increase ROM / Strength to [facility] to allow normal body mechanics with all mobility. MET  -Improve function. Restorative sleep / Abolish HAS / tolerance of static posture not limited by pain.  MET  -Restore patients ability to perform her job without pain. - Partially met  -Patient is able to return to performing regular gyn workout - partially met     DC Summary: this patient has been seen for 11 visits for neck pain.  She has met or partially met 88% of her goal.  She had limited cervical range of motion and now she has full  range of motion.  She has increased her functional abilities, and has further gains to make.  Some of these limitations may be because of her knee pain, which she acquired in the same accident.  She is going to be seen for for knee and we will DC her treament for her neck.  She will continue with her home exercise to promote continued increase in function and resolving limitations she has as a result of her neck pain.      Total Session Time 44  THERAPEUTIC EXERCISE 44 minutes      Wyatt Mage, PT  10/19/2021, 09:44

## 2021-10-26 ENCOUNTER — Inpatient Hospital Stay
Admission: RE | Admit: 2021-10-26 | Discharge: 2021-10-26 | Disposition: A | Discharge status: Home / Self Care | Payer: 59 | Source: Ambulatory Visit | Attending: NURSE PRACTITIONER | Admitting: NURSE PRACTITIONER

## 2021-10-26 ENCOUNTER — Other Ambulatory Visit: Payer: Self-pay

## 2021-10-26 DIAGNOSIS — M25571 Pain in right ankle and joints of right foot: Secondary | ICD-10-CM | POA: Insufficient documentation

## 2021-11-03 ENCOUNTER — Other Ambulatory Visit: Payer: Self-pay

## 2021-11-03 ENCOUNTER — Ambulatory Visit
Admission: RE | Admit: 2021-11-03 | Discharge: 2021-11-03 | Disposition: A | Discharge status: Still In Hospital | Payer: 59 | Source: Ambulatory Visit | Attending: NURSE PRACTITIONER | Admitting: NURSE PRACTITIONER

## 2021-11-03 DIAGNOSIS — M25562 Pain in left knee: Secondary | ICD-10-CM
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
## 2021-11-04 ENCOUNTER — Other Ambulatory Visit (HOSPITAL_COMMUNITY): Payer: Self-pay

## 2021-11-04 DIAGNOSIS — M25562 Pain in left knee: Secondary | ICD-10-CM

## 2021-11-09 ENCOUNTER — Ambulatory Visit (HOSPITAL_COMMUNITY)
Admission: RE | Admit: 2021-11-09 | Discharge: 2021-11-09 | Disposition: A | Discharge status: Still In Hospital | Payer: 59 | Source: Ambulatory Visit

## 2021-11-09 ENCOUNTER — Other Ambulatory Visit: Payer: Self-pay

## 2021-11-09 DIAGNOSIS — M542 Cervicalgia: Secondary | ICD-10-CM | POA: Insufficient documentation

## 2021-11-09 DIAGNOSIS — M25561 Pain in right knee: Secondary | ICD-10-CM | POA: Insufficient documentation

## 2021-11-09 DIAGNOSIS — M25562 Pain in left knee: Secondary | ICD-10-CM | POA: Insufficient documentation

## 2021-11-09 NOTE — PT Treatment (Signed)
[facility]  Outpatient Physical Therapy  [place], [address]  [phone]  [phone]    Physical Therapy Treatment Note    Date: 11/09/2021  Patient's Name: Laurie Eaton  Date of Birth: [birth year]            Visit #/POC: 2/ 6-12  Authorization:2/9 authorized       Evaluating Physical Therapist: Rosalio Macadamia DPT  PT diagnosis/Reason for Referral: L knee pain  Next Scheduled Physician Appointment: unknown          Subjective: Pt states doing ok.  States her knee pain is about baseline today.  States she does have constant pain.  Will see foot doctor tomorrow and will ask about inserts.      Objective: Mainly worked on patellar and tib/fib mobility.  Trial of Kinesiotape as below with handout for care and removal of tape      EXERCISE/ACTIVITY NAME REPETITIONS RESISTANCE COMPLETED THIS DOS   Patellar mobilization all direction   na na yes   Tib/fib ant/post glide   na na yes   Kinesiotape; Y tape for inferior patellar glide and medial patellar glide;    na na yes   Kinesiotape; I tape for correction over inferior patellar pole and medial joint line   na na yes                                                 Assessment: Tolerated well.  Profoundly limited mobility of patella and tib/fib. Pt did note some relief after session stating her knee "didn't feel as stiff".     Plan: Will monitor effects of treatment and proceed accordingly    STGs: 3 Weeks  1.  Patient will demonstrate improved left knee AROM to at least 10 to 110 degrees to aid in ability to ambulate up and down stairs.  2.  Patient will demonstrate independence with progressive HEP to maximize gains from physical therapy.  3.  Patient will report max 5/10 pain to aid in completion of ADLs/work duties.    LTGs: 6 Weeks  1.  Patient will demonstrate improved left knee strength to at least 4+/5 to aid in functional transfers.  2.  Patient will demonstrate improved left hip strength to at least  3+/5 to aid in return to PLOF.  3.  Patient will demonstrate 10 squats/sit <> stands with 5# and good form to aid in completion of ADLs.  4.  Patient will demonstrate improved functional ability with Patient Specific Scale Score of at least 4.5.      Total Session Time 30 and Timed code minutes 30  JOINT MOBILIZATION/MFR 30 minutes      Waino Mounsey, PTA  11/09/2021, 17:28

## 2021-11-11 ENCOUNTER — Ambulatory Visit (HOSPITAL_COMMUNITY)
Admission: RE | Admit: 2021-11-11 | Discharge: 2021-11-11 | Disposition: A | Discharge status: Still In Hospital | Payer: 59 | Source: Ambulatory Visit

## 2021-11-11 ENCOUNTER — Other Ambulatory Visit: Payer: Self-pay

## 2021-11-11 NOTE — PT Treatment (Signed)
[facility]  Outpatient Physical Therapy  [place], [address]  [phone]  [phone]    Physical Therapy Treatment Note    Date: 11/11/2021  Patient's Name: Laurie Eaton  Date of Birth: 1976-03-30    Visit # 3 of up to 12 planned  Authorization: 3/9 of current authorization  Plan of Care Signed?: No  Plan of Care Ends: 6/13  Next Progress Note Due: 5/2    Evaluating Physical Therapist: Joellen Jersey  PT Diagnosis: Left knee pain  Next Scheduled (Referring) Physician Appointment: After PT  Allergies/Contraindications: Penicillan    Time In: [id number] Time Out: 1742    Subjective: Patient rates left knee pain 7/10 while ambulating.  She notes that she feels like her left knee is less swollen.  She states that she can't tell if the KT has made a difference.  Notes that she went to the podiatrist and will be taking oral steroids and was fitted for orthotics for her feet.  She was offered a boot, but decided with the podiatrist that it might make her left knee worse.    Objective:   Progressed exercises/therapy: Progressed treatment per below  Cuing required: Min A Verbal, visual, and tactile cues for completion of exercises with correct form  Objective measure(s):   Left Knee AROM: 15 - 87 degrees  EXERCISE/ACTIVITY NAME REPETITIONS RESISTANCE Last completed   Patellar mobilization all direction   na na 5/8   Tib/fib ant/post glide   Posterior glides to distal femur and proximal tibia Grades 1-2 for pain relief 5/10   Kinesiotape; Y tape for inferior patellar glide and medial patellar glide;    na na 5/8   Kinesiotape; I tape for correction over inferior patellar pole and medial joint line   na na 5/8   DKTC with Swiss ball  1x10  5/10   Seated hamstring curls with TB   1x10 Red TB 5/10   Standing TKE with TB resistance 1x10 Red TB 5/10   Narrow base of support balance on airex pad   2x60"  5/10               Patient education: Updated HEP to  include:  Exercises  - Standing Quad Set  - 2 x daily - 7 x weekly - 1 sets - 10 reps    Assessment: Patient tolerated treatment well without adverse effects.  She demonstrated improvements in her extension AROM, though her flexion was quite limited compared to her initial evaluation.  She was unable to completed seated hip abduction machine secondary to knee pain, though did not report increased pain with standing TKEs.  She did struggle also with balance exercise, though this is likely in part secondary to her right foot pain.    Plan: Continue PT POC, please attempt hip abduction/extension strengthening exercises as appropriate    Goals:    STGs:3Weeks  1. Patient will demonstrate improved leftknee AROM to at least 10to 110degrees to aid in ability to ambulate up and down stairs.  2. Patient will demonstrate independence with progressive HEP to maximize gains from physical therapy.  3. Patient will report max 5/10 pain to aid in completion of ADLs/work duties.    LTGs:6Weeks  1. Patient will demonstrate improved leftknee strength to at least 4+/5 toaid in functional transfers.  2. Patient will demonstrate improved left hip strength to at least 3+/5 to aid in return to PLOF.  3. Patient will demonstrate 10 squats/sit <>  stands with 5# and good form to aid in completion of ADLs.  4. Patient will demonstrate improved functional ability with Patient Specific Scale Score of at least 4.5.    Total Session Time 38 and Timed code minutes 38  THERAPEUTIC EXERCISE 38 minutes    Orion Crook, PT  11/11/2021, 17:06

## 2021-11-17 ENCOUNTER — Other Ambulatory Visit: Payer: Self-pay

## 2021-11-17 ENCOUNTER — Ambulatory Visit (HOSPITAL_COMMUNITY)
Admission: RE | Admit: 2021-11-17 | Discharge: 2021-11-17 | Disposition: A | Discharge status: Still In Hospital | Payer: 59 | Source: Ambulatory Visit

## 2021-11-17 NOTE — PT Treatment (Signed)
[facility]  Outpatient Physical Therapy  [address]  [place], [address]  [phone]  (Fax) [phone]    Physical Therapy Treatment Note    Date: 11/17/2021  Patient's Name: Laurie Eaton  Date of Birth: February 02, 1976    Visit # 4 of up to 12 planned  Authorization: 4/9 of current authorization  Plan of Care Signed?: No  Plan of Care Ends: 6/13  Next Progress Note Due: 5/2    Evaluating Physical Therapist: Florentina Addison  PT Diagnosis: Left knee pain  Next Scheduled (Referring) Physician Appointment: After PT  Allergies/Contraindications: Penicillan    Time In: 1615 Time Out: 1657    Subjective: Patient rates left knee pain 6/10 upon presentation.  She notes that she believes that her left knee is getting straighter when she ambulates.    Objective:   Progressed exercises/therapy: Progressed treatment per below  Cuing required: Min A Verbal, visual, and tactile cues for completion of exercises with correct form  Objective measure(s):   Left Knee AROM: 10 - 100 degrees  EXERCISE/ACTIVITY NAME REPETITIONS RESISTANCE Last completed   Patellar mobilization all direction   na na 5/8   Tib/fib ant/post glide   Posterior glides to distal femur and proximal tibia Grades 1-2 for pain relief 5/10   Kinesiotape; Y tape for inferior patellar glide and medial patellar glide;    na na 5/8   Kinesiotape; I tape for correction over inferior patellar pole and medial joint line   na na 5/8   DKTC with Swiss ball  2x10  5/16   Seated hamstring curls with TB   2x10 Greent 5/16   Standing TKE with TB resistance 1x10 Green TB 5/16   Narrow base of support balance on airex pad   1x60"  5/16     Nu-step warm up 5' L4 5/16   Swiss ball bridges - calves resting on ball 2x10  5/16   Seated LAQ 1x10 5# 5/16   Monster walks Forwards/Backwards and Laterally 3x length of // bars Green TB 5/16   Modified tandem balance on airex pad 1x60"  5/16   Sidelying hip abduction 1x10 5# 5/16   Clamshells 2x10  Green TB 5/16   Supine psoas march 1x10 Green TB 5/16   Gentle PROM Flexion and extension  5/16       Patient education: Continue HEP    Assessment: Patient tolerated treatment well without adverse effects.  She demonstrated very good improvement in her left knee AROM.  She denied worsening or improvement of pain following treatment.    Plan: Continue PT POC    Goals:    STGs:3Weeks  1. Patient will demonstrate improved leftknee AROM to at least 10to 110degrees to aid in ability to ambulate up and down stairs.  2. Patient will demonstrate independence with progressive HEP to maximize gains from physical therapy.  3. Patient will report max 5/10 pain to aid in completion of ADLs/work duties.    LTGs:6Weeks  1. Patient will demonstrate improved leftknee strength to at least 4+/5 toaid in functional transfers.  2. Patient will demonstrate improved left hip strength to at least 3+/5 to aid in return to PLOF.  3. Patient will demonstrate 10 squats/sit <>stands with 5# and good form to aid in completion of ADLs.  4. Patient will demonstrate improved functional ability with Patient Specific Scale Score of at least 4.5.    Total Session Time 42 and Timed code minutes 42  THERAPEUTIC EXERCISE 42  minutes    Samella Parr, PT

## 2021-11-19 ENCOUNTER — Ambulatory Visit (HOSPITAL_COMMUNITY)
Admission: RE | Admit: 2021-11-19 | Discharge: 2021-11-19 | Disposition: A | Discharge status: Still In Hospital | Payer: 59 | Source: Ambulatory Visit

## 2021-11-19 ENCOUNTER — Other Ambulatory Visit: Payer: Self-pay

## 2021-11-19 NOTE — PT Treatment (Signed)
[facility]  Outpatient Physical Therapy  [address]  [place], [address]  [phone]  (Fax) [phone]    Physical Therapy Treatment Note    Date: 11/19/2021  Patient's Name: Laurie Eaton  Date of Birth: 05-27-1976    Visit # 5 of up to 12 planned  Authorization: 5/9 of current authorization  Plan of Care Signed?: No  Plan of Care Ends: 6/13  Next Progress Note Due: 5/2    Evaluating Physical Therapist: Florentina Addison  PT Diagnosis: Left knee pain  Next Scheduled (Referring) Physician Appointment: After PT  Allergies/Contraindications: Penicillan    Time In: 1620 Time Out: 1655    Subjective: Patient rates left knee pain 4-5/10 upon presentation.  She notes that her left knee is feeling much better and she was able to walk her dog and shoot some basketball with her son.  She notes that she has completed 3/4 days of steroids prescribed to her right foot.      Objective:   Progressed exercises/therapy: Progressed treatment per below  Cuing required: Min A Verbal, visual, and tactile cues for completion of exercises with correct form  Objective measure(s):   Left Knee AROM: 8- 103 degrees  EXERCISE/ACTIVITY NAME REPETITIONS RESISTANCE Last completed   Patellar mobilization all direction   na na 5/8   Tib/fib ant/post glide   Posterior glides to distal femur and proximal tibia Grades 1-2 for pain relief 5/10   Kinesiotape; Y tape for inferior patellar glide and medial patellar glide;    na na 5/8   Kinesiotape; I tape for correction over inferior patellar pole and medial joint line   na na 5/8   DKTC with Swiss ball  2x10  5/16   Seated hamstring curls with TB   2x10 Greent 5/16   Standing TKE with TB resistance 1x10 Green TB 5/16   Narrow base of support balance on airex pad   1x60"  5/16     Nu-step warm up 5' L4 5/18   Swiss ball bridges - calves resting on ball 2x10  5/18   Seated LAQ 1x10 5# 5/16   Monster walks Forwards/Backwards and Laterally 3x length of //  bars Blue TB at knees 5/18   Modified tandem balance on airex pad 1x60"  5/16   Sidelying hip abduction 2x10 5# 5/18   Clamshells 2x10 Blue TB 5/18   Supine psoas march 2x10 Blue TB 5/18   Gentle PROM Flexion and extension  5/16   Left leg press 3x10 60# 5/18   Seated hip abduction machine 1x10 35# 5/18   Sidelying hip adduction 2x10 0# 5/18   Prone hip extension 2x10 5# 5/18   Tandem gait on foam beam 4 lengths in // bars  5/18   Patient education: Continue HEP, advised patient to try water exercises including marching balance, walking forwards/backwards/laterally, and trying to kick with kick board for hip strengthening/endurance    Assessment: Patient tolerated treatment well without adverse effects, though her gait pattern was more antalgic leaving the clinic.  She struggled greatly with progressed balance exercises today, particularly with tandem balance on foam.  Note that SLR with ankle weight was trialled, but was unable to be completed secondary to pain.    Plan: Continue PT POC    Goals:    STGs:3Weeks  1. Patient will demonstrate improved leftknee AROM to at least 10to 110degrees to aid in ability to ambulate up and down stairs.  2. Patient will demonstrate independence  with progressive HEP to maximize gains from physical therapy.  3. Patient will report max 5/10 pain to aid in completion of ADLs/work duties.    LTGs:6Weeks  1. Patient will demonstrate improved leftknee strength to at least 4+/5 toaid in functional transfers.  2. Patient will demonstrate improved left hip strength to at least 3+/5 to aid in return to PLOF.  3. Patient will demonstrate 10 squats/sit <>stands with 5# and good form to aid in completion of ADLs.  4. Patient will demonstrate improved functional ability with Patient Specific Scale Score of at least 4.5.    Total Session Time 35 and Timed code minutes 35  THERAPEUTIC EXERCISE 35 minutes    Samella Parr, PT

## 2021-11-23 ENCOUNTER — Ambulatory Visit (HOSPITAL_COMMUNITY)
Admission: RE | Admit: 2021-11-23 | Discharge: 2021-11-23 | Disposition: A | Discharge status: Still In Hospital | Payer: 59 | Source: Ambulatory Visit

## 2021-11-23 ENCOUNTER — Other Ambulatory Visit: Payer: Self-pay

## 2021-11-23 NOTE — PT Treatment (Signed)
[facility]  Outpatient Physical Therapy  [address]  [place], [address]  [phone]  (Fax) [phone]    Physical Therapy Treatment Note    Date: 11/23/2021  Patient's Name: Laurie Eaton  Date of Birth: 1975/10/22    Visit # 6 of up to 12 planned  Authorization: 6/9 of current authorization  Plan of Care Signed?: No  Plan of Care Ends: 6/13  Next Progress Note Due: 5/2    Evaluating Physical Therapist: Florentina Addison  PT Diagnosis: Left knee pain  Next Scheduled (Referring) Physician Appointment: After PT  Allergies/Contraindications: Penicillan    Time In: 1701 Time Out: 1745    Subjective: Patient rates left knee pain 5/10.  She notes that she is achier, but feels likes she is more mobile.  She notes that she is less swollen and 5 more days of prednisone.  She notes she felt very good over the weekend, but is a little worse today after work.    Objective:   Progressed exercises/therapy: Progressed treatment per below  Cuing required: Min A Verbal, visual, and tactile cues for completion of exercises with correct form  Objective measure(s):   Left Knee AROM: 15-108 degrees  EXERCISE/ACTIVITY NAME REPETITIONS RESISTANCE Last completed   Patellar mobilization all direction   na na 5/8   Tib/fib ant/post glide   Posterior glides to distal femur and proximal tibia Grades 1-2 for pain relief 5/10   Kinesiotape; Y tape for inferior patellar glide and medial patellar glide;    na na 5/8   Kinesiotape; I tape for correction over inferior patellar pole and medial joint line   na na 5/8   DKTC with Swiss ball  2x10  5/22   Seated hamstring curls with TB   2x10 Greent 5/16   Standing TKE with TB resistance 1x10 Green TB 5/16   Narrow base of support balance on airex pad   1x60"  5/16     Nu-step warm up 5' L5 5/22   Swiss ball bridges - calves resting on ball 2x10  5/18   Seated LAQ 1x10 5# 5/16   Monster walks Forwards/Backwards and Laterally 3x length of // bars Blue TB  at knees 5/18   Modified tandem balance on airex pad 1x60"  5/16   Sidelying hip abduction 3x10 5# 5/22   Clamshells 2x10 Black TB 5/22   Supine psoas march 2x10 Blue TB 5/18   Gentle PROM Flexion and extension  5/22   Left leg press 2x10 70# 5/22   Seated hip abduction machine 1x10 35# 5/18   Sidelying hip adduction 3x10 0# 5/22   Prone hip extension 3x10 5# 5/22   Tandem gait on foam beam 4 lengths in // bars  5/18   Tandem balance on foam 2x30" each  5/22   Calf stretch on large incline 1x60"  5/22   Prone knee flexion curls 1x10 5# 5/22- HOLD popping and clicking               Patient education: Continue HEP, advised patient to try water exercises including marching balance, walking forwards/backwards/laterally, and trying to kick with kick board for hip strengthening/endurance    Assessment: Patient tolerated treatment well without adverse effects.  Her knee was stiffer today than last visit, so exercises for AROM were increased initially.  She reported painful popping/clicking with prone knee flexion curls.  Note that patient continues to struggle more with tandem balance with left LE posterior to right.  Plan: Continue PT POC    Goals:    STGs:3Weeks  1. Patient will demonstrate improved leftknee AROM to at least 10to 110degrees to aid in ability to ambulate up and down stairs.  2. Patient will demonstrate independence with progressive HEP to maximize gains from physical therapy.  3. Patient will report max 5/10 pain to aid in completion of ADLs/work duties.    LTGs:6Weeks  1. Patient will demonstrate improved leftknee strength to at least 4+/5 toaid in functional transfers.  2. Patient will demonstrate improved left hip strength to at least 3+/5 to aid in return to PLOF.  3. Patient will demonstrate 10 squats/sit <>stands with 5# and good form to aid in completion of ADLs.  4. Patient will demonstrate improved functional ability with Patient Specific Scale Score of at least  4.5.    Total Session Time 44 and Timed code minutes 44  THERAPEUTIC EXERCISE 44 minutes    Samella Parr, PT

## 2021-11-26 ENCOUNTER — Other Ambulatory Visit: Payer: Self-pay

## 2021-11-26 ENCOUNTER — Ambulatory Visit
Admission: RE | Admit: 2021-11-26 | Discharge: 2021-11-26 | Disposition: A | Discharge status: Still In Hospital | Payer: 59 | Source: Ambulatory Visit | Attending: NURSE PRACTITIONER | Admitting: NURSE PRACTITIONER

## 2021-11-26 NOTE — PT Treatment (Signed)
[facility]  Outpatient Physical Therapy  [place], [address]  [phone]  [phone]    Physical Therapy Treatment Note    Date: 11/26/2021  Patient's Name: Laurie Eaton  Date of Birth: 02-28-1976    Visit # 7 of up to 12 planned  Authorization: 7/9 of current authorization  Plan of Care Signed?: No  Plan of Care Ends: 6/13  Next Progress Note Due: 5/2    Evaluating Physical Therapist: Joellen Jersey  PT Diagnosis: Left knee pain  Next Scheduled (Referring) Physician Appointment: After PT  Allergies/Contraindications: Penicillan    Time In: 1700 Time Out: 1737    Subjective: Patient rates left knee pain 7/10.  She reports that it is giving a lot and it is stiff.  She is down to 1 pill of prednisone for her right foot daily now.  She notes that she will be getting foot orthoses next week.    Objective:   Progressed exercises/therapy: Regressed treatment per below secondary to increased left knee pain  Cuing required: Min A Verbal, visual, and tactile cues for completion of exercises with correct form  Objective measure(s):   Left patellofemoral joint mobility: profoundly hypomobile  EXERCISE/ACTIVITY NAME REPETITIONS RESISTANCE Last completed   Patellar mobilization all direction   na na 5/8   Tib/fib ant/post glide   Posterior glides to distal femur and proximal tibia Grades 1-2 for pain relief 5/10   Kinesiotape; Y tape for inferior patellar glide and medial patellar glide;    na na 5/8   Kinesiotape; I tape for correction over inferior patellar pole and medial joint line   na na 5/8   DKTC with Swiss ball  2x10  5/25   Seated hamstring curls with TB   2x10 Green TB 5/16   Standing TKE with TB resistance 1x10 Green TB 5/16   Narrow base of support balance on airex pad   1x60"  5/16     Nu-step warm up 5' L3 5/25   Swiss ball bridges - calves resting on ball 2x10  5/25   Seated LAQ 1x10 5# 5/16   Monster walks Forwards/Backwards and Laterally 3x  length of // bars Blue TB at knees 5/18   Modified tandem balance on airex pad 1x60"  5/16   Sidelying hip abduction 3x10 5# 5/22   Clamshells 2x10 Black TB 5/22   Supine psoas march 2x10 Blue TB 5/18   Gentle PROM Flexion and extension  5/26   Left leg press 2x10 70# 5/22   Seated hip abduction machine 1x10 35# 5/18   Sidelying hip adduction 3x10 0# 5/22   Prone hip extension 3x10 5# 5/22   Tandem gait on foam beam 4 lengths in // bars  5/18   Tandem balance on foam 2x30" each  5/25   Calf stretch on large incline 1x60"  5/22   Prone knee flexion curls 1x10 5# 5/22- HOLD popping and clicking   Alternating SLR from Swiss ball  2x10  5/25   Supine tibiofemoral joint mobilzations AP Over small bolster Grades 2-3 5/25   Patient education: Continue HEP, provided handouts for water aerobics type exercises:  Exercises  - Warrior I in [employer] with [employer]  - 1 x daily - 2 x weekly - 2 sets - 10 reps  - Warrior II in [employer] with [employer]  - 1 x daily - 2 x weekly - 2 sets - 10 reps  -  Warrior III in [employer] with [employer]  - 1 x daily - 2 x weekly - 2 sets - 10 reps  - Chair Pose in [employer] with [employer]  - 1 x daily - 2 x weekly - 2 sets - 10 reps  - Falling Star Pose in [place] with [employer]  - 1 x daily - 2 x weekly - 2 sets - 10 reps  - Reverse Warrior in [employer] with [employer]  - 1 x daily - 2 x weekly - 2 sets - 10 reps  - Full Triangle Pose in [employer] with [employer]  - 1 x daily - 2 x weekly - 2 sets - 10 reps    Assessment: Patient tolerated treatment well without adverse effects.  Her knee was more painful than last visit, so weight bearing exercises were not tolerated well, though did well with more hip focused open chain exercises.  Her left knee remains profoundly hypomobile throughout.     Plan: Continue PT POC    Goals:    STGs:3Weeks  1. Patient will demonstrate improved leftknee AROM to at least 10to 110degrees to aid in ability to ambulate  up and down stairs.  2. Patient will demonstrate independence with progressive HEP to maximize gains from physical therapy.  3. Patient will report max 5/10 pain to aid in completion of ADLs/work duties.    LTGs:6Weeks  1. Patient will demonstrate improved leftknee strength to at least 4+/5 toaid in functional transfers.  2. Patient will demonstrate improved left hip strength to at least 3+/5 to aid in return to PLOF.  3. Patient will demonstrate 10 squats/sit <>stands with 5# and good form to aid in completion of ADLs.  4. Patient will demonstrate improved functional ability with Patient Specific Scale Score of at least 4.5.    Total Session Time 37 and Timed code minutes 37  THERAPEUTIC EXERCISE 20 minutes and JOINT MOBILIZATION/MFR 17 minutes    Orion Crook, PT

## 2021-11-27 DIAGNOSIS — M779 Enthesopathy, unspecified: Secondary | ICD-10-CM | POA: Insufficient documentation

## 2021-12-02 ENCOUNTER — Encounter (HOSPITAL_COMMUNITY): Payer: Self-pay

## 2021-12-02 ENCOUNTER — Ambulatory Visit (HOSPITAL_COMMUNITY): Payer: Self-pay

## 2021-12-07 ENCOUNTER — Ambulatory Visit (HOSPITAL_COMMUNITY)
Admission: RE | Admit: 2021-12-07 | Discharge: 2021-12-07 | Disposition: A | Discharge status: Still In Hospital | Payer: 59 | Source: Ambulatory Visit

## 2021-12-07 ENCOUNTER — Other Ambulatory Visit: Payer: Self-pay

## 2021-12-07 DIAGNOSIS — M25561 Pain in right knee: Secondary | ICD-10-CM | POA: Insufficient documentation

## 2021-12-07 DIAGNOSIS — M542 Cervicalgia: Secondary | ICD-10-CM | POA: Insufficient documentation

## 2021-12-07 DIAGNOSIS — M25562 Pain in left knee: Secondary | ICD-10-CM | POA: Insufficient documentation

## 2021-12-07 NOTE — PT Treatment (Signed)
[facility]  Outpatient Physical Therapy  [address]  [place], [address]  [phone]  (Fax) [phone]    Physical Therapy Treatment Note    Date: 12/07/2021  Patient's Name: Laurie Eaton  Date of Birth: 1975-08-08            Visit #/POC:8/ 12  Authorization: 8/9       Evaluating Physical Therapist: Bridgett Larsson DPT   PT diagnosis/Reason for Referral: L knee pain   Next Scheduled Physician Appointment:           Subjective: Pt reports knee is doing some better.  States pain is some better.  Notes she is off of steroids so has noticed pain is slowly returning. Hopes to return to orthopedic doctor soon for consult.    Objective: Warm up on Nustep followed by activities noted below    EXERCISE/ACTIVITY NAME REPETITIONS RESISTANCE Last completed   Patellar mobilization all direction   na na 5/8   Tib/fib ant/post glide   Posterior glides to distal femur and proximal tibia Grades 1-2 for pain relief 5/10   Kinesiotape; Y tape for inferior patellar glide and medial patellar glide;    na na 5/8   Kinesiotape; I tape for correction over inferior patellar pole and medial joint line   na na 5/8   DKTC with Swiss ball  2x10  6/ 5/23   Seated hamstring curls with TB   2x10 Green TB 5/16   Standing TKE with TB resistance 1x10 Green TB 5/16   Narrow base of support balance on airex pad   1x60"  5/16     Nu-step warm up 5' L3  12/07/21   Swiss ball bridges - calves resting on ball 2x10   12/07/21   Seated LAQ 1x10 5# 5/16   Monster walks Forwards/Backwards and Laterally 3x length of // bars Blue TB at knees 5/18   Modified tandem balance on airex pad 1x60"  5/16   Sidelying hip abduction 3x10 5# 5/22   Clamshells 2x10 Black TB 5/22   Supine psoas march 2x10 Blue TB 5/18   Gentle PROM Flexion and extension  5/26   Left leg press 2x10 70# 5/22   Seated hip abduction machine 1x10 35# 5/18   Sidelying hip adduction 3x10 0# 5/22   Prone hip extension 3x10 5# 5/22   Tandem gait on  foam beam 4 lengths in // bars  5/18   Tandem balance on foam 2x30" each  5/25   Calf stretch on large incline 1x60"  5/22   Prone knee flexion curls 1x10 5# 5/22- HOLD popping and clicking   Alternating SLR from Swiss ball  2x10   12/07/21    quad set with ball squeeze  5 sec x 10   12/07/21         Supine tibiofemoral joint mobilzations AP Over small bolster Grades 2-3  12/07/21             Assessment: Pt tolerated well.  She has made some improvement since starting therapy but remains very limited with ROM.  Continues to have pain but notes is less than before therapy    Plan: PT will reassess next visit    Goals:    STGs:3Weeks  1. Patient will demonstrate improved leftknee AROM to at least 10to 110degrees to aid in ability to ambulate up and down stairs.  2. Patient will demonstrate independence with progressive HEP to maximize gains from physical therapy.  3. Patient will report max 5/10 pain to aid in completion of ADLs/work duties.    LTGs:6Weeks  1. Patient will demonstrate improved leftknee strength to at least 4+/5 toaid in functional transfers.  2. Patient will demonstrate improved left hip strength to at least 3+/5 to aid in return to PLOF.  3. Patient will demonstrate 10 squats/sit <>stands with 5# and good form to aid in completion of ADLs.  4. Patient will demonstrate improved functional ability with Patient Specific Scale Score of at least 4.5.      Total Session Time 35 and Timed code minutes 35  THERAPEUTIC EXERCISE 35 minutes      Laurie Eaton, PTA  12/07/2021, 17:15

## 2021-12-09 ENCOUNTER — Encounter (HOSPITAL_COMMUNITY): Payer: Self-pay

## 2021-12-09 ENCOUNTER — Ambulatory Visit (HOSPITAL_COMMUNITY): Payer: Self-pay

## 2021-12-11 ENCOUNTER — Ambulatory Visit
Admission: RE | Admit: 2021-12-11 | Discharge: 2021-12-11 | Disposition: A | Discharge status: Still In Hospital | Payer: 59 | Source: Ambulatory Visit | Attending: NURSE PRACTITIONER | Admitting: NURSE PRACTITIONER

## 2021-12-11 ENCOUNTER — Other Ambulatory Visit: Payer: Self-pay

## 2021-12-11 NOTE — PT Treatment (Signed)
[facility]  Outpatient Physical Therapy  [place], [address]  [phone]  [phone]    Physical Therapy Treatment Note    Date: 12/11/2021  Patient's Name: Laurie Eaton  Date of Birth: 07/01/1976            Visit #/POC: 9  Authorization: 9/9      Evaluating Physical Therapist: Rosalio Macadamia DPT  PT diagnosis/Reason for Referral: L knee pain  Next Scheduled Physician Appointment: unknown          Subjective: States her knee is about 40% of normal.  Notes worst pain over past week 7/10.  That is usually quick pain that resolves.  Average pain 6/10.  At best 5/10.  Notes she still has constant pain but notes she is moving much better.     Objective: Warm up on Nustep.  Activities as noted below.  Review of goals and review of HEP.  All questions and concerns addressed. AROM in supine 15-105,  MMT flexion/ext 4+/5.  L hip 3+/5 grossly      Patient-Specific Functional Score: (was 2.5 @ eval)  Problem Score   1. Walk dog 6   2. Move better at work 6   Total: 6    EXERCISE/ACTIVITY NAME REPETITIONS RESISTANCE Last completed   Patellar mobilization all direction   na na 5/8   Tib/fib ant/post glide   Posterior glides to distal femur and proximal tibia Grades 1-2 for pain relief 5/10   Kinesiotape; Y tape for inferior patellar glide and medial patellar glide;    na na 5/8   Kinesiotape; I tape for correction over inferior patellar pole and medial joint line   na na 5/8   DKTC with Swiss ball  2x10  6/ 5/23   Seated hamstring curls with TB   2x10 GreenTB 5/16   Standing TKE with TB resistance 1x10 Green TB 5/16   Narrow base of support balance on airex pad   1x60"  5/16     Nu-step warm up 5' L3  12/11/21   Swiss ball bridges - calves resting on ball 2x10   12/07/21   Seated LAQ 1x10 5# 5/16   Monster walks Forwards/Backwards and Laterally 3x length of // bars Blue TB at knees 5/18   Modified tandem balance on airex pad 1x60"  5/16   Sidelying hip  abduction 3x10 5# 5/22   Clamshells 2x10 Black TB 5/22   Supine psoas march 2x10 Blue TB 5/18   Gentle PROM Flexion and extension  5/26   Left leg press 2x10 70# 5/22   Seated hip abduction machine 1x10 35# 5/18   Sidelying hip adduction 3x10 0# 5/22   Prone hip extension 3x10 5# 5/22   Tandem gait on foam beam 4 lengths in // bars  5/18   Tandem balance on foam 2x30" each  5/25   Calf stretch on large incline 1x60"  5/22   Prone knee flexion curls 1x10 5# 5/22- HOLD popping and clicking   Alternating SLR from Swiss ball  2x10   12/07/21    quad set with ball squeeze  5 sec x 10   12/07/21         Squat to table  2x10  5#  12/11/21         Supine tibiofemoral joint mobilzations AP Over small bolster Grades 2-3  12/07/21  Assessment: Pt has made some improvement with therapy.  She continues to have significant pain and limitation in ROM.  However overall function and tolerance to activities greatly improved.  Please see goals below    Plan:  Pt will continue with HEP for now    Goals:   STGs:3Weeks  1. Patient will demonstrate improved leftknee AROM to at least 10to 110degrees to aid in ability to ambulate up and down stairs.  15-105 on 12/11/21  2. Patient will demonstrate independence with progressive HEP to maximize gains from physical therapy.  Met  3. Patient will report max 5/10 pain to aid in completion of ADLs/work duties. Not met (6-7/10)    LTGs:6Weeks  1. Patient will demonstrate improved leftknee strength to at least 4+/5 toaid in functional transfers. 4+/5 at testing postion  2. Patient will demonstrate improved left hip strength to at least 3+/5 to aid in return to PLOF.  Met 12/11/21  3. Patient will demonstrate 10 squats/sit <>stands with 5# and good form to aid in completion of ADLs. Still limited knee extension however no pain and no hesitation and has equal wt bearing with squats   4. Patient will demonstrate improved functional ability with Patient Specific  Scale Score of at least 4.5  Met 12/11/21      Total Session Time 20 and Timed code minutes 20  THERAPEUTIC EXERCISE 20 minutes      Hervey Wedig, PTA  12/11/2021, 17:04

## 2021-12-18 DIAGNOSIS — E669 Obesity, unspecified: Secondary | ICD-10-CM | POA: Insufficient documentation

## 2022-06-14 ENCOUNTER — Other Ambulatory Visit (HOSPITAL_COMMUNITY): Payer: Self-pay | Admitting: NURSE PRACTITIONER

## 2022-06-14 DIAGNOSIS — Z1239 Encounter for other screening for malignant neoplasm of breast: Secondary | ICD-10-CM

## 2022-09-13 ENCOUNTER — Ambulatory Visit (HOSPITAL_COMMUNITY): Payer: Self-pay

## 2022-09-22 ENCOUNTER — Other Ambulatory Visit: Payer: Self-pay

## 2022-09-22 ENCOUNTER — Inpatient Hospital Stay
Admission: RE | Admit: 2022-09-22 | Discharge: 2022-09-22 | Disposition: A | Discharge status: Home / Self Care | Payer: 59 | Source: Ambulatory Visit | Attending: NURSE PRACTITIONER | Admitting: NURSE PRACTITIONER

## 2022-09-22 ENCOUNTER — Encounter (HOSPITAL_COMMUNITY): Payer: Self-pay

## 2022-09-22 DIAGNOSIS — Z1231 Encounter for screening mammogram for malignant neoplasm of breast: Secondary | ICD-10-CM | POA: Insufficient documentation

## 2022-09-22 DIAGNOSIS — Z1239 Encounter for other screening for malignant neoplasm of breast: Secondary | ICD-10-CM

## 2022-10-19 ENCOUNTER — Emergency Department
Admission: EM | Admit: 2022-10-19 | Discharge: 2022-10-19 | Disposition: A | Discharge status: Home / Self Care | Payer: 59 | Attending: Family | Admitting: Family

## 2022-10-19 ENCOUNTER — Encounter (HOSPITAL_COMMUNITY): Payer: Self-pay | Admitting: Family

## 2022-10-19 ENCOUNTER — Other Ambulatory Visit: Payer: Self-pay

## 2022-10-19 ENCOUNTER — Emergency Department (HOSPITAL_COMMUNITY): Payer: 59

## 2022-10-19 DIAGNOSIS — M1711 Unilateral primary osteoarthritis, right knee: Secondary | ICD-10-CM | POA: Insufficient documentation

## 2022-10-19 DIAGNOSIS — M1990 Unspecified osteoarthritis, unspecified site: Secondary | ICD-10-CM

## 2022-10-19 DIAGNOSIS — M25461 Effusion, right knee: Secondary | ICD-10-CM | POA: Insufficient documentation

## 2022-10-19 LAB — CBC WITH DIFF
BASOPHIL #: 0.1 10*3/uL (ref 0.00–0.10)
BASOPHIL %: 1 % (ref 0–1)
EOSINOPHIL #: 0.2 10*3/uL (ref 0.00–0.50)
EOSINOPHIL %: 2 %
HCT: 39.5 % (ref 31.2–41.9)
HGB: 13.5 g/dL (ref 10.9–14.3)
LYMPHOCYTE #: 2.5 10*3/uL (ref 1.00–3.00)
LYMPHOCYTE %: 25 % (ref 16–44)
MCH: 29.8 pg (ref 24.7–32.8)
MCHC: 34.1 g/dL (ref 32.3–35.6)
MCV: 87.6 fL (ref 75.5–95.3)
MONOCYTE #: 0.7 10*3/uL (ref 0.30–1.00)
MONOCYTE %: 7 % (ref 5–13)
MPV: 7.6 fL — ABNORMAL LOW (ref 7.9–10.8)
NEUTROPHIL #: 6.4 10*3/uL (ref 1.85–7.80)
NEUTROPHIL %: 65 % (ref 43–77)
PLATELETS: 244 10*3/uL (ref 140–440)
RBC: 4.51 10*6/uL (ref 3.63–4.92)
RDW: 13.8 % (ref 12.3–17.7)
WBC: 9.8 10*3/uL (ref 3.8–11.8)

## 2022-10-19 LAB — BASIC METABOLIC PANEL
ANION GAP: 7 mmol/L (ref 4–13)
BUN/CREA RATIO: 17 (ref 6–22)
BUN: 16 mg/dL (ref 7–25)
CALCIUM: 9.1 mg/dL (ref 8.6–10.3)
CHLORIDE: 107 mmol/L (ref 98–107)
CO2 TOTAL: 26 mmol/L (ref 21–31)
CREATININE: 0.94 mg/dL (ref 0.60–1.30)
ESTIMATED GFR: 75 mL/min/{1.73_m2} (ref >59)
GLUCOSE: 93 mg/dL (ref 74–109)
OSMOLALITY, CALCULATED: 280 mOsm/kg (ref 270–290)
POTASSIUM: 3.4 mmol/L — ABNORMAL LOW (ref 3.5–5.1)
SODIUM: 140 mmol/L (ref 136–145)

## 2022-10-19 LAB — URIC ACID: URIC ACID: 4.7 mg/dL (ref 2.3–7.6)

## 2022-10-19 MED ORDER — NAPROXEN 500 MG TABLET
500.0000 mg | ORAL_TABLET | Freq: Two times a day (BID) | ORAL | 0 refills | Status: DC
Start: 2022-10-19 — End: 2023-05-27

## 2022-10-19 MED ORDER — PREDNISONE 50 MG TABLET
50.0000 mg | ORAL_TABLET | Freq: Every day | ORAL | 0 refills | Status: AC
Start: 2022-10-19 — End: 2022-10-24

## 2022-10-19 NOTE — ED Triage Notes (Signed)
Bilateral knee pain x2 weeks, states her knees are stiff that started today. Denies injury.

## 2022-10-19 NOTE — ED Provider Notes (Signed)
[facility]  ED Primary Provider Note  History of Present Illness   Chief Complaint   Patient presents with    Knee Pain     Laurie Eaton is a 47 y.o. female who had concerns including Knee Pain.  Arrival: The patient arrived by Car    Patient is a 47 year old female to the emergency room complaining of bilateral knee pain.  Patient states she has had stiffness that is worsened for the past 2 weeks.  Patient states she was very active and exercises frequently.  Patient states Motrin with no relief at home.  Patient currently rating pain a 4/10 but states that it has been worse lately.  Patient states worse with ambulation and better with rest.  Patient denies history of gout and denies any fever chills or myalgias.      History Reviewed This Encounter: Medical History  Surgical History  Family History  Social History    Physical Exam   ED Triage Vitals [10/19/22 1748]   BP (Non-Invasive) (!) 148/94   Heart Rate 77   Respiratory Rate 18   Temperature 36.7 C (98 F)   SpO2 100 %   Weight 102 kg (225 lb)   Height 1.702 m ( )     Physical Exam  Vitals and nursing note reviewed.   Constitutional:       General: She is not in acute distress.     Appearance: She is well-developed.   HENT:      Head: Normocephalic and atraumatic.   Eyes:      Conjunctiva/sclera: Conjunctivae normal.   Cardiovascular:      Rate and Rhythm: Normal rate and regular rhythm.      Heart sounds: No murmur heard.  Pulmonary:      Effort: Pulmonary effort is normal. No respiratory distress.      Breath sounds: Normal breath sounds.   Abdominal:      Palpations: Abdomen is soft.      Tenderness: There is no abdominal tenderness.   Musculoskeletal:         General: No swelling.      Cervical back: Neck supple.   Skin:     General: Skin is warm and dry.      Capillary Refill: Capillary refill takes less than 2 seconds.   Neurological:      Mental Status: She is alert.   Psychiatric:         Mood and Affect:  Mood normal.       Patient Data     Labs Ordered/Reviewed   BASIC METABOLIC PANEL - Abnormal; Notable for the following components:       Result Value    POTASSIUM 3.4 (*)     All other components within normal limits    Narrative:     Estimated Glomerular Filtration Rate (eGFR) is calculated using the CKD-EPI (2021) equation, intended for patients 63 years of age and older. If gender is not documented or "unknown", there will be no eGFR calculation.     CBC WITH DIFF - Abnormal; Notable for the following components:    MPV 7.6 (*)     All other components within normal limits   URIC ACID - Normal   CBC/DIFF    Narrative:     The following orders were created for panel order CBC/DIFF.  Procedure  Abnormality         Status                     ---------                               -----------         ------                     CBC WITH ZOXW[[id number]]                Abnormal            Final result                 Please view results for these tests on the individual orders.     XR KNEE RIGHT 4 OR MORE VIEW   Final Result by Edi, Radresults In (04/16 1836)   DJD. KNEE JOINT EFFUSION.                Radiologist location ID: [id number]           Medical Decision Making        Medical Decision Making  Patient is a 47 year old female to the emergency room complaining of bilateral knee pain.  Patient states she has had stiffness that is worsened for the past 2 weeks.  On physical examination mild erythema to the lateral knees.  No erythema noted.  Full range of motion with popping noted.  X-rays ordered for evaluation showing small joint effusion and degenerative joint disease.  CBC CMP and uric acid all within normal limits.  Upon reviewing with patient, patient no longer in emergency department therefore unable to aspirate fluid from knee.  Patient prescribed prednisone and naproxen and follow up with Orthopedics.  Discussed on patient verbalized understanding.  Patient otherwise to  return to ED if fever, worsening pain or erythema.    Amount and/or Complexity of Data Reviewed  Labs: ordered.  Radiology: ordered.    Risk  Prescription drug management.                   Clinical Impression   Osteoarthritis, unspecified osteoarthritis type, unspecified site (Primary)       Disposition: Discharged

## 2022-10-19 NOTE — ED Nurses Note (Signed)
Pt evaluated and treated by medical provider while in waiting room area. No primary RN assigned; therefore, no assessment performed. All paperwork given and questions answered. Pt acknowledged all understanding. Leaving waiting area.

## 2023-01-27 IMAGING — MR MRI KNEE RT W/O CONTRAST
5 series · 40 of 40 positions shown · IV contrast (gadolinium)
Comparison: None available.

﻿EXAM:  [id number]   MRI KNEE RT W/O CONTRAST
INDICATION: 46-year-old with chronic right knee pain and swelling, predominantly anterior pain.  Sustained trauma in June 2021.  No history of knee surgery of the right knee.
TECHNIQUE: Multiplanar, multisequential MRI of the right knee was performed without gadolinium contrast.

[Series 5: PD fat-sat · axial · right · 4.0mm · 0.59mm/px · z∈[-54,+76]mm · 9 of 30 slices shown (1 of 3)]
[im 1/30]
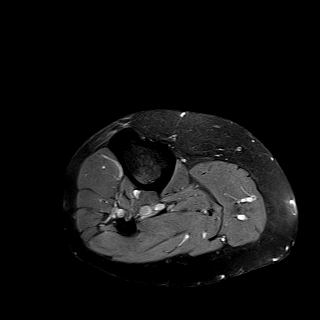
[im 4/30]
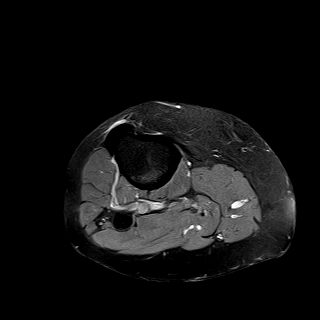
[im 8/30]
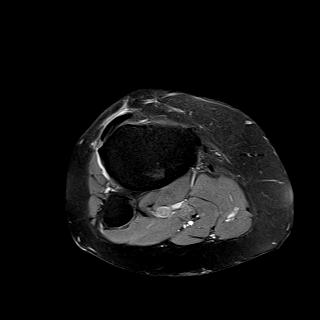
[im 11/30]
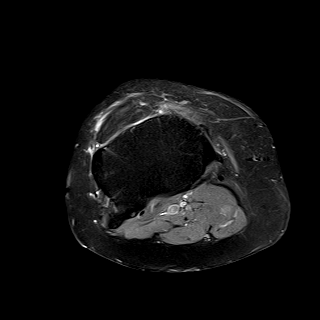
[im 15/30]
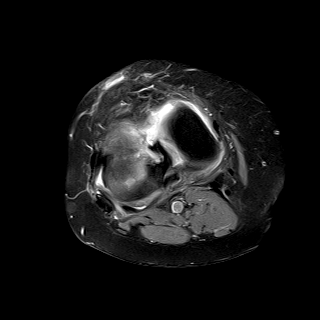
[im 19/30]
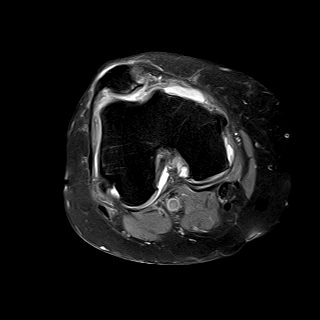
[im 22/30]
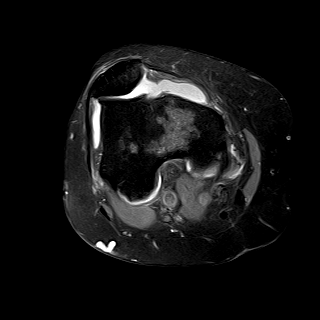
[im 26/30]
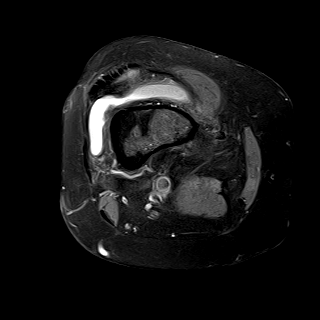
[im 30/30]
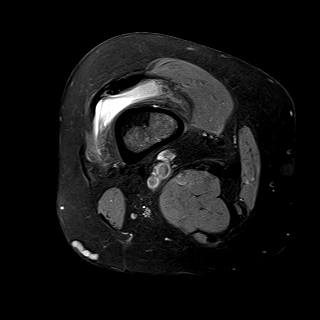

[Series 6: PD fat-sat · sagittal · right · 3.0mm · 0.53mm/px · 9 of 30 slices shown (2 of 3)]
[im 1/30]
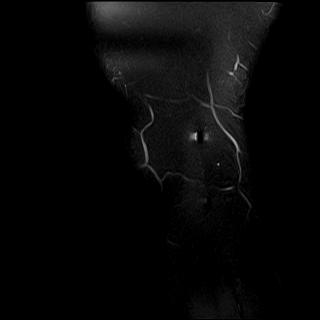
[im 4/30]
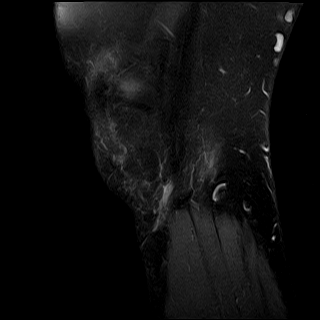
[im 8/30]
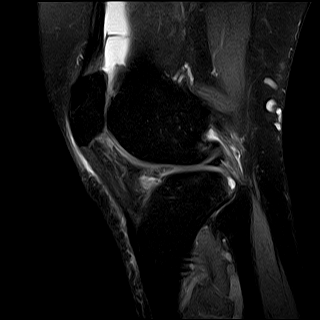
[im 11/30]
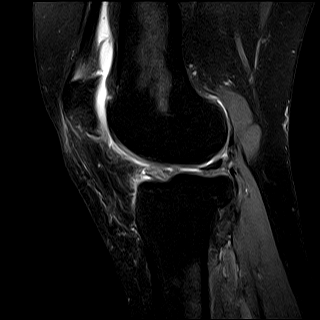
[im 15/30]
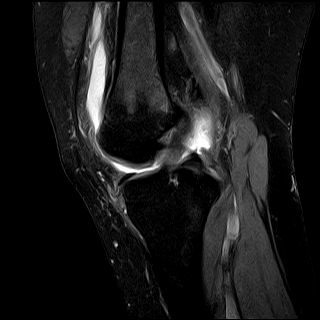
[im 19/30]
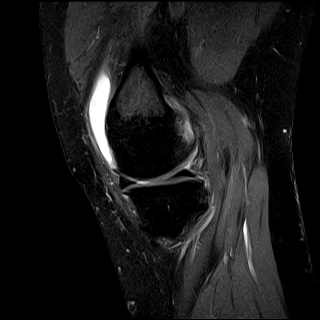
[im 22/30]
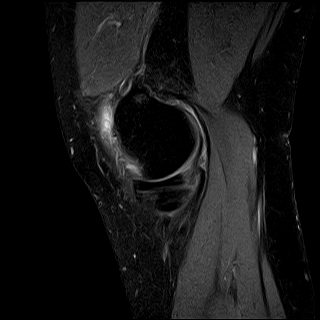
[im 26/30]
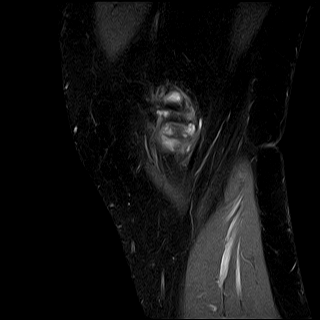
[im 30/30]
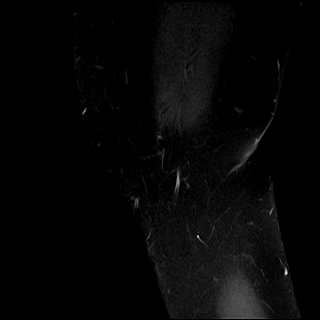

[Series 7: T1 · sagittal · right · 3.0mm · 0.44mm/px · 8 of 30 slices shown]
[im 1/30]
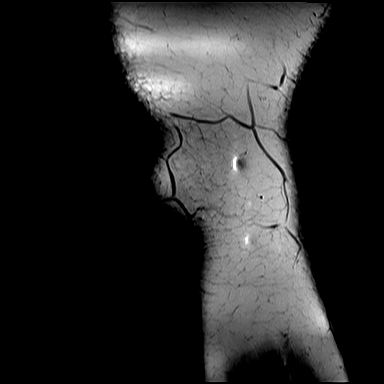
[im 5/30]
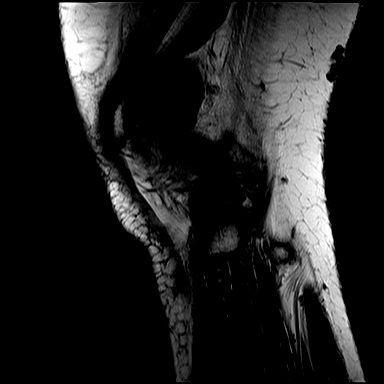
[im 9/30]
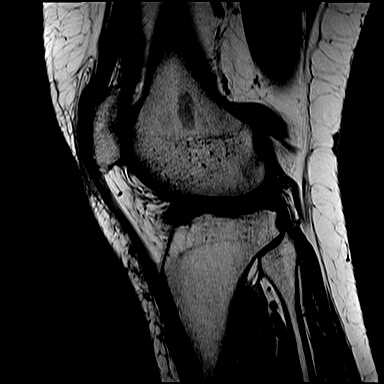
[im 13/30]
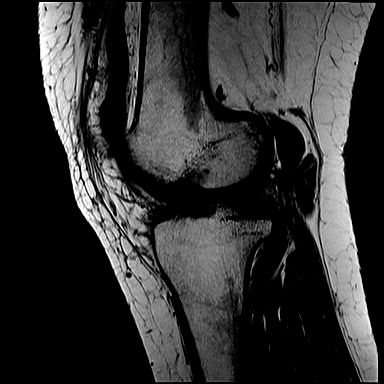
[im 17/30]
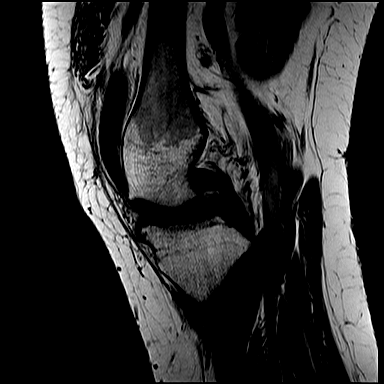
[im 21/30]
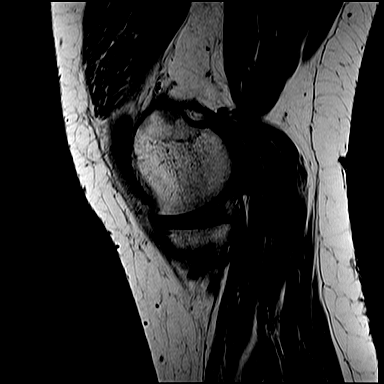
[im 25/30]
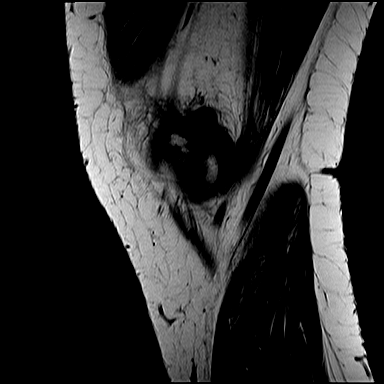
[im 30/30]
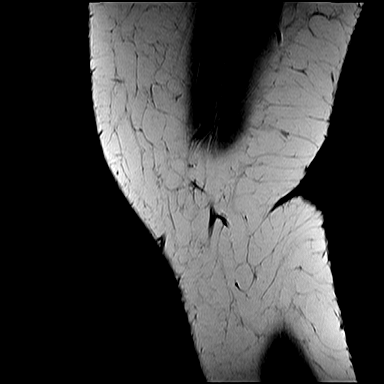

[Series 8: STIR · coronal · right · 3.5mm · 0.53mm/px · 7 of 26 slices shown]
[im 1/26]
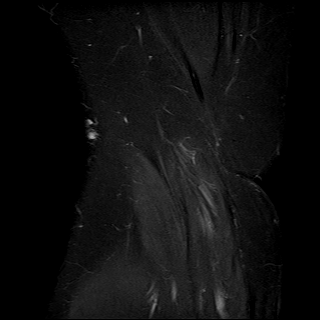
[im 5/26]
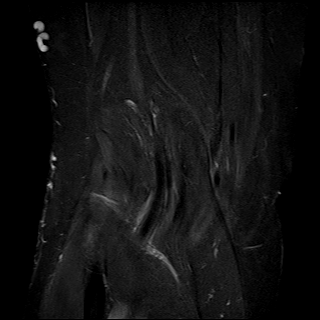
[im 9/26]
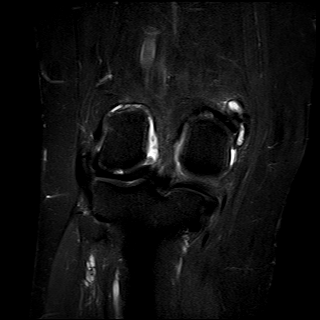
[im 13/26]
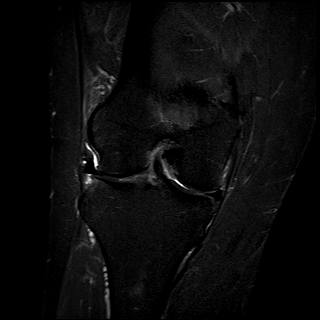
[im 17/26]
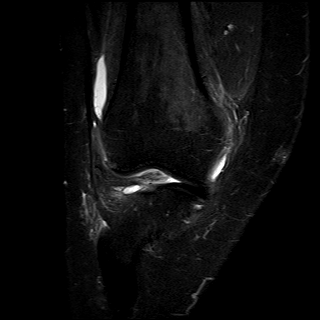
[im 21/26]
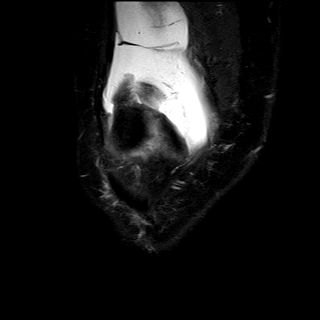
[im 26/26]
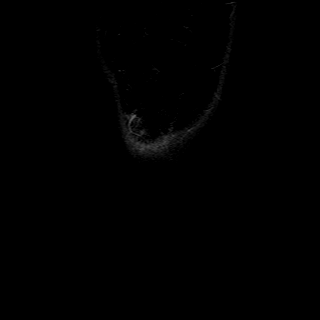

[Series 9: PD fat-sat · coronal · right · 3.5mm · 0.53mm/px · 7 of 26 slices shown (3 of 3)]
[im 1/26]
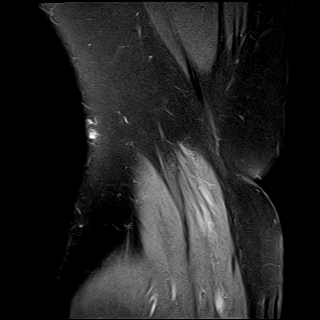
[im 5/26]
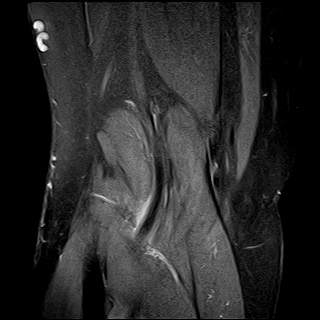
[im 9/26]
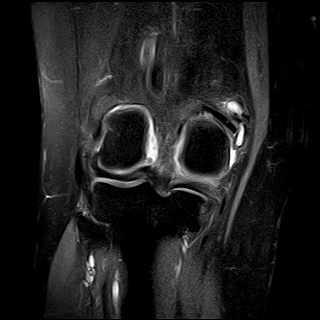
[im 13/26]
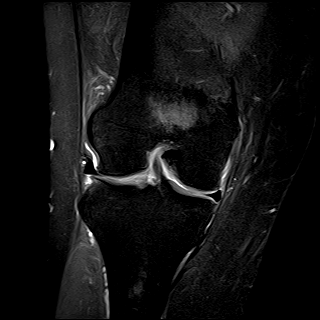
[im 17/26]
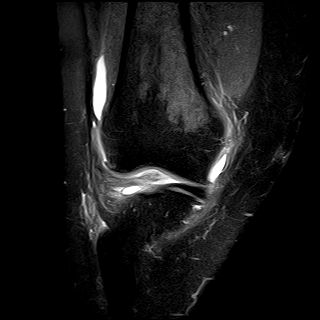
[im 21/26]
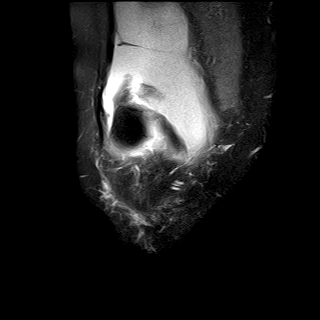
[im 26/26]
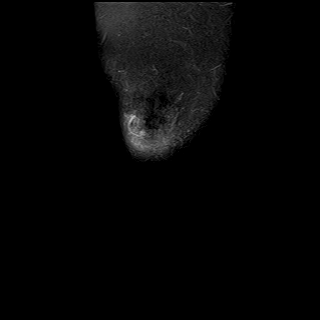

[40 of 40 positions shown; findings below may reference images not displayed]

FINDINGS: No acute bony lesions are seen on both sides of the knee joint. 

Medial meniscus shows no evidence of tear. Minimal degenerative changes of medial articular cartilage are noted. 

Lateral compartment shows significant grade 2 to grade 3 degenerative changes of the articular cartilage over the lateral femoral condyle.  Significant degenerative changes and chronic complex tear of the anterior horn of the lateral meniscus are noted.

Anterior and posterior cruciate ligaments are intact.  Collateral ligaments are intact. 

Quadriceps tendon and patellar tendon are intact.  Significant grade 3 degenerative changes of the lateral facet of the patellofemoral joint are noted.  Large effusion is noted in the knee joint.  No evidence of osteochondral loose bodies are seen.  Infrapatellar bursitis is noted.
IMPRESSION: 1. No acute bony lesions at the right knee. 

2. Significant degenerative changes and chronic complex tear of the anterior horn of the lateral meniscus are noted.

3. Significant grade 2 to grade 3 degenerative changes of articular cartilage over the lateral femoral condyle.  Significant grade 3 degenerative changes of lateral facet of patellofemoral joint.

4. Large effusion in the knee joint.

## 2023-04-13 ENCOUNTER — Emergency Department (HOSPITAL_BASED_OUTPATIENT_CLINIC_OR_DEPARTMENT_OTHER): Payer: 59

## 2023-04-13 ENCOUNTER — Emergency Department
Admission: EM | Admit: 2023-04-13 | Discharge: 2023-04-13 | Disposition: A | Discharge status: Home / Self Care | Payer: 59 | Attending: Family | Admitting: Family

## 2023-04-13 ENCOUNTER — Other Ambulatory Visit: Payer: Self-pay

## 2023-04-13 ENCOUNTER — Encounter (HOSPITAL_BASED_OUTPATIENT_CLINIC_OR_DEPARTMENT_OTHER): Payer: Self-pay

## 2023-04-13 DIAGNOSIS — R9431 Abnormal electrocardiogram [ECG] [EKG]: Secondary | ICD-10-CM

## 2023-04-13 DIAGNOSIS — Z3200 Encounter for pregnancy test, result unknown: Secondary | ICD-10-CM | POA: Insufficient documentation

## 2023-04-13 DIAGNOSIS — L03211 Cellulitis of face: Secondary | ICD-10-CM | POA: Insufficient documentation

## 2023-04-13 DIAGNOSIS — I4510 Unspecified right bundle-branch block: Secondary | ICD-10-CM | POA: Insufficient documentation

## 2023-04-13 DIAGNOSIS — D219 Benign neoplasm of connective and other soft tissue, unspecified: Secondary | ICD-10-CM | POA: Insufficient documentation

## 2023-04-13 LAB — COMPREHENSIVE METABOLIC PANEL, NON-FASTING
ALBUMIN/GLOBULIN RATIO: 1.1 (ref 0.8–1.4)
ALBUMIN: 3.6 g/dL (ref 3.4–5.0)
ALKALINE PHOSPHATASE: 61 U/L (ref 46–116)
ALT (SGPT): 20 U/L (ref <=78)
ANION GAP: 12 mmol/L (ref 4–13)
AST (SGOT): 29 U/L (ref 15–37)
BILIRUBIN TOTAL: 2 mg/dL — ABNORMAL HIGH (ref 0.2–1.0)
BUN/CREA RATIO: 21
BUN: 18 mg/dL (ref 7–18)
CALCIUM, CORRECTED: 9.1 mg/dL
CALCIUM: 8.8 mg/dL (ref 8.5–10.1)
CHLORIDE: 103 mmol/L (ref 98–107)
CO2 TOTAL: 23 mmol/L (ref 21–32)
CREATININE: 0.87 mg/dL (ref 0.55–1.02)
ESTIMATED GFR: 83 mL/min/{1.73_m2} (ref >59)
GLOBULIN: 3.3
GLUCOSE: 83 mg/dL (ref 74–106)
OSMOLALITY, CALCULATED: 277 mosm/kg (ref 270–290)
POTASSIUM: 3.6 mmol/L (ref 3.5–5.1)
PROTEIN TOTAL: 6.9 g/dL (ref 6.4–8.2)
SODIUM: 138 mmol/L (ref 136–145)

## 2023-04-13 LAB — ECG 12 LEAD
Atrial Rate: 72 {beats}/min
Calculated P Axis: 49 degrees
Calculated R Axis: 0 degrees
Calculated T Axis: 21 degrees
PR Interval: 178 ms
QRS Duration: 130 ms
QT Interval: 442 ms
QTC Calculation: 483 ms
Ventricular rate: 72 {beats}/min

## 2023-04-13 LAB — CBC WITH DIFF
BASOPHIL #: 0.02 10*3/uL (ref 0.00–0.30)
BASOPHIL %: 0 % (ref 0–3)
EOSINOPHIL #: 0.12 10*3/uL (ref 0.00–0.80)
EOSINOPHIL %: 1 % (ref 1–7)
HCT: 39.1 % (ref 37.0–47.0)
HGB: 13.7 g/dL (ref 12.5–16.0)
LYMPHOCYTE #: 2.27 10*3/uL (ref 1.10–5.00)
LYMPHOCYTE %: 26 % (ref 25–45)
MCH: 31.6 pg (ref 27.0–32.0)
MCHC: 35 g/dL (ref 32.0–36.0)
MCV: 90.4 fL (ref 78.0–99.0)
MONOCYTE #: 0.55 10*3/uL (ref 0.00–1.30)
MONOCYTE %: 6 % (ref 0–12)
MPV: 7.1 fL — ABNORMAL LOW (ref 7.4–10.4)
NEUTROPHIL #: 5.63 10*3/uL (ref 1.80–8.40)
NEUTROPHIL %: 66 % (ref 40–76)
PLATELETS: 196 10*3/uL (ref 140–440)
RBC: 4.33 10*6/uL (ref 4.20–5.40)
RDW: 16.4 % — ABNORMAL HIGH (ref 11.6–14.8)
WBC: 8.6 10*3/uL (ref 4.0–10.5)

## 2023-04-13 LAB — URINALYSIS, MACRO/MICRO
BILIRUBIN: NEGATIVE mg/dL
GLUCOSE: NEGATIVE mg/dL
LEUKOCYTES: NEGATIVE WBCs/uL
NITRITE: POSITIVE — AB
PH: 6 (ref 4.6–8.0)
PROTEIN: NEGATIVE mg/dL
SPECIFIC GRAVITY: 1.02 (ref 1.003–1.035)
UROBILINOGEN: 0.2 mg/dL (ref 0.2–1.0)

## 2023-04-13 LAB — TROPONIN-I
TROPONIN I: 5 ng/L (ref <15)
TROPONIN I: 5 ng/L (ref <15)

## 2023-04-13 LAB — URINALYSIS, MICROSCOPIC

## 2023-04-13 LAB — HCG QUALITATIVE PREGNANCY, SERUM: PREGNANCY, SERUM QUALITATIVE: NEGATIVE

## 2023-04-13 LAB — MAGNESIUM: MAGNESIUM: 1.9 mg/dL (ref 1.8–2.4)

## 2023-04-13 MED ORDER — SODIUM CHLORIDE 0.9 % (FLUSH) INJECTION SYRINGE
3.0000 mL | INJECTION | Freq: Three times a day (TID) | INTRAMUSCULAR | Status: DC
Start: 2023-04-13 — End: 2023-04-13

## 2023-04-13 MED ORDER — CLINDAMYCIN 900 MG/50 ML IN 0.9% SODIUM CHLORIDE INTRAVENOUS PIGGYBACK
INJECTION | INTRAVENOUS | Status: AC
Start: 2023-04-13 — End: 2023-04-13
  Filled 2023-04-13: qty 50

## 2023-04-13 MED ORDER — KETOROLAC 30 MG/ML (1 ML) INJECTION SOLUTION
INTRAMUSCULAR | Status: AC
Start: 2023-04-13 — End: 2023-04-13
  Filled 2023-04-13: qty 1

## 2023-04-13 MED ORDER — KETOROLAC 10 MG TABLET
10.0000 mg | ORAL_TABLET | Freq: Four times a day (QID) | ORAL | 0 refills | Status: DC | PRN
Start: 2023-04-13 — End: 2023-05-27

## 2023-04-13 MED ORDER — KETOROLAC 30 MG/ML (1 ML) INJECTION SOLUTION
30.0000 mg | INTRAMUSCULAR | Status: AC
Start: 2023-04-13 — End: 2023-04-13
  Administered 2023-04-13: 30 mg via INTRAVENOUS

## 2023-04-13 MED ORDER — CLINDAMYCIN 900 MG/50 ML IN 0.9% SODIUM CHLORIDE INTRAVENOUS PIGGYBACK
900.0000 mg | INJECTION | INTRAVENOUS | Status: AC
Start: 2023-04-13 — End: 2023-04-13
  Administered 2023-04-13: 900 mg via INTRAVENOUS

## 2023-04-13 MED ORDER — SODIUM CHLORIDE 0.9 % (FLUSH) INJECTION SYRINGE
3.0000 mL | INJECTION | INTRAMUSCULAR | Status: DC | PRN
Start: 2023-04-13 — End: 2023-04-13

## 2023-04-13 MED ORDER — CLINDAMYCIN HCL 300 MG CAPSULE
300.0000 mg | ORAL_CAPSULE | Freq: Four times a day (QID) | ORAL | 0 refills | Status: AC
Start: 2023-04-13 — End: 2023-04-23

## 2023-04-13 NOTE — Discharge Instructions (Signed)
Please do a recheck here tomorrow. We dont know how significant this place is in the left mandible it warrants follow up

## 2023-04-13 NOTE — ED Provider Notes (Signed)
[facility], [facility] Emergency Department  ED Primary Provider Note  History of Present Illness   Chief Complaint   Patient presents with    Facial Swelling     Arrival: The patient arrived by Ambulance  Laurie Eaton is a 47 y.o. female who had concerns including Facial Swelling. Pt states she just woke up this am left facial edema, feeling hot and states just dx with HTN today has not started meds yet. Denies diff swallowing cp sob nor other co.   Review of Systems   Constitutional: No fever, chills or weakness   Skin: No rash or diaphoresis+ facial swelling.   HENT: + headaches, no  congestion  Eyes: No vision changes or photophobia   Cardio: No chest pain, palpitations or leg swelling   Respiratory: No cough, wheezing or SOB  GI:  No nausea, vomiting or stool changes  GU:  No dysuria, hematuria, or increased frequency  MSK: No muscle aches, joint or back pain  Neuro: No seizures, LOC, numbness, tingling, or focal weakness  Psychiatric: No depression, SI or substance abuse  All other systems reviewed and are negative.    History Reviewed This Encounter: all noted and reviewed    Physical Exam   ED Triage Vitals   BP (Non-Invasive) 04/13/23 1358 (!) 179/105   Heart Rate 04/13/23 1358 84   Respiratory Rate 04/13/23 1358 20   Temperature 04/13/23 1358 37 C (98.6 F)   SpO2 04/13/23 1358 100 %   Weight 04/13/23 1357 102 kg (225 lb)   Height 04/13/23 1357 1.702 m (5\' 7" )       Constitutional:  47 y.o. female who appears in no distress. Normal color, no cyanosis.   HENT:   Head: Normocephalic and atraumatic. + left maxillary mild swelling mild redness no crepitus full ROM of mouth . Mild dental caries.   Mouth/Throat: Oropharynx is clear and moist.   Eyes: EOMI, PERRL   Neck: Trachea midline. Neck supple.  Cardiovascular: RRR, No murmurs, rubs or gallops. Intact distal pulses.  Pulmonary/Chest: BS equal bilaterally. No respiratory distress. No wheezes, rales or chest tenderness.    Abdominal: Bowel sounds present and normal. Abdomen soft, no tenderness, no rebound and no guarding.  Back: No midline spinal tenderness, no paraspinal tenderness, no CVA tenderness.           Musculoskeletal: No edema, tenderness or deformity.  Skin: warm and dry. No rash, erythema, pallor or cyanosis  Psychiatric: normal mood and affect. Behavior is normal.   Neurological: Patient keenly alert and responsive, easily able to raise eyebrows, facial muscles/expressions symmetric, speaking in fluent sentences, moving all extremities equally and fully, normal gait  Patient Data     Labs Ordered/Reviewed   COMPREHENSIVE METABOLIC PANEL, NON-FASTING - Abnormal; Notable for the following components:       Result Value    BILIRUBIN TOTAL 2.0 (*)     All other components within normal limits    Narrative:     Estimated Glomerular Filtration Rate (eGFR) is calculated using the CKD-EPI (2021) equation, intended for patients 37 years of age and older. If gender is not documented or "unknown", there will be no eGFR calculation.   CBC WITH DIFF - Abnormal; Notable for the following components:    RDW 16.4 (*)     MPV 7.1 (*)     All other components within normal limits   URINALYSIS, MACRO/MICRO - Abnormal; Notable for the following components:    NITRITE Positive (*)  KETONES Trace (*)     BLOOD Small (*)     All other components within normal limits   URINALYSIS, MICROSCOPIC - Abnormal; Notable for the following components:    BACTERIA Moderate (*)     SQUAMOUS EPITHELIAL Several (*)     All other components within normal limits   MAGNESIUM - Normal   HCG QUALITATIVE PREGNANCY, SERUM - Normal   URINE CULTURE,ROUTINE   CBC/DIFF    Narrative:     The following orders were created for panel order CBC/DIFF.  Procedure                               Abnormality         Status                     ---------                               -----------         ------                     CBC WITH YQIH[[id number]]                 Abnormal            Final result                 Please view results for these tests on the individual orders.   URINALYSIS WITH REFLEX MICROSCOPIC AND CULTURE IF POSITIVE    Narrative:     The following orders were created for panel order URINALYSIS WITH REFLEX MICROSCOPIC AND CULTURE IF POSITIVE.  Procedure                               Abnormality         Status                     ---------                               -----------         ------                     URINALYSIS, MACRO/MICRO[[id number]]      Abnormal            Final result               URINALYSIS, MICROSCOPIC[[id number]]      Abnormal            Final result                 Please view results for these tests on the individual orders.   TROPONIN-I     CT BRAIN WO IV CONTRAST   Final Result by Edi, Radresults In (10/09 1618)   NO ACUTE INTRACRANIAL FINDINGS.         One or more dose reduction techniques were used (e.g., Automated exposure control, adjustment of the mA and/or kV according to patient size, use of iterative reconstruction technique).         Radiologist location ID: [id number]         CT FACIAL BONES WO  IV CONTRAST   Final Result by Edi, Radresults In (10/09 1617)   1. LEFT PREMAXILLARY/INFRAORBITAL CELLULITIS. NO SPECIFIC ETIOLOGY IS IDENTIFIED.   2. 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         Radiologist location ID: [id number]           Medical Decision Making   Diff dx of dental abscess. Facial cellulitis. Ct shows cellulitis an poss mandibular fibroma  + nitrate urine no sx will culture. Trop 5 wbc 8.6.pt to do a 24 hour recheck , sooner if worse .            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        Disposition: Discharged

## 2023-04-13 NOTE — ED Triage Notes (Signed)
[facility] states patient complaining of left side facial swelling with numbness and tingle and started this morning.  Hot sensation and blurry vision and headache. Recent diagnose of elevated BP and right bundle branch.  Patient has not picked up blood pressure medication.

## 2023-04-14 ENCOUNTER — Emergency Department
Admission: EM | Admit: 2023-04-14 | Discharge: 2023-04-14 | Disposition: A | Discharge status: Home / Self Care | Payer: 59 | Attending: Family | Admitting: Family

## 2023-04-14 ENCOUNTER — Encounter (HOSPITAL_BASED_OUTPATIENT_CLINIC_OR_DEPARTMENT_OTHER): Payer: Self-pay

## 2023-04-14 ENCOUNTER — Other Ambulatory Visit: Payer: Self-pay

## 2023-04-14 DIAGNOSIS — L03818 Cellulitis of other sites: Secondary | ICD-10-CM

## 2023-04-14 DIAGNOSIS — K029 Dental caries, unspecified: Secondary | ICD-10-CM | POA: Insufficient documentation

## 2023-04-14 DIAGNOSIS — L03211 Cellulitis of face: Secondary | ICD-10-CM | POA: Insufficient documentation

## 2023-04-14 LAB — TROPONIN-I: TROPONIN I: 3 ng/L (ref <15)

## 2023-04-14 MED ORDER — CEFTRIAXONE 1 GRAM SOLUTION FOR INJECTION
INTRAMUSCULAR | Status: AC
Start: 2023-04-14 — End: 2023-04-14
  Filled 2023-04-14: qty 10

## 2023-04-14 MED ORDER — LIDOCAINE HCL 10 MG/ML (1 %) INJECTION SOLUTION
INTRAMUSCULAR | Status: AC
Start: 2023-04-14 — End: 2023-04-14
  Filled 2023-04-14: qty 20

## 2023-04-14 MED ORDER — LIDOCAINE HCL 10 MG/ML (1 %) INJECTION SOLUTION
1.0000 g | Freq: Once | INTRAMUSCULAR | Status: AC
Start: 2023-04-14 — End: 2023-04-14
  Administered 2023-04-14: 1 g via INTRAMUSCULAR

## 2023-04-14 NOTE — ED Provider Notes (Signed)
[facility], [facility] Emergency Department  ED Primary Provider Note  History of Present Illness   Chief Complaint   Patient presents with    Wound Check     Arrival: The patient arrived by Car  Laurie Eaton is a 47 y.o. female who had concerns including Wound Check. Pt was asked to return today for a recheck left facial cellulitis. She states she is much better, less swelling no fever no diff opening mouth nor swallowing. Was able to fill cleocin and start it.had iv yesterday ct face and labs.   Review of Systems   Constitutional: No fever, chills or weakness   Skin: No rash or diaphoresis+ recheck cellulitis.   HENT: No headaches, or congestion  Eyes: No vision changes or photophobia   Cardio: No chest pain, palpitations or leg swelling   Respiratory: No cough, wheezing or SOB  GI:  No nausea, vomiting or stool changes  GU:  No dysuria, hematuria, or increased frequency  MSK: No muscle aches, joint or back pain  Neuro: No seizures, LOC, numbness, tingling, or focal weakness  Psychiatric: No depression, SI or substance abuse  All other systems reviewed and are negative.    History Reviewed This Encounter: all noted and reviewed.     Physical Exam   ED Triage Vitals [04/14/23 1841]   BP (Non-Invasive) (!) 151/102   Heart Rate 69   Respiratory Rate 20   Temperature 36.7 C (98.1 F)   SpO2 100 %   Weight 102 kg (225 lb)   Height 1.702 m (5\' 7" )     Constitutional:  47 y.o. female who appears in no distress. Normal color, no cyanosis.   HENT:   Head: Normocephalic and atraumatic.  Decreased redness and swelling left maxillary area non tender. Full ROM of mouth. Mild dental caries   Mouth/Throat: Oropharynx is clear and moist.   Eyes: EOMI, PERRL   Neck: Trachea midline. Neck supple.  Cardiovascular: RRR, No murmurs, rubs or gallops. Intact distal pulses.  Pulmonary/Chest: BS equal bilaterally. No respiratory distress. No wheezes, rales or chest tenderness.   Abdominal: Bowel sounds  present and normal. Abdomen soft, no tenderness, no rebound and no guarding.  Back: No midline spinal tenderness, no paraspinal tenderness, no CVA tenderness.           Musculoskeletal: No edema, tenderness or deformity.  Skin: warm and dry. No rash, erythema, pallor or cyanosis  Psychiatric: normal mood and affect. Behavior is normal.   Neurological: Patient keenly alert and responsive, easily able to raise eyebrows, facial muscles/expressions symmetric, speaking in fluent sentences, moving all extremities equally and fully, normal gait  Patient Data   Labs Ordered/Reviewed - No data to display  No orders to display     Medical Decision Making   Diff dx of dental abscess. Facial cellulitis pt states she just started her new BP meds / states she can tell she is better . No distress no co. Encouraged close follow up             Medications Administered in the ED   cefTRIAXone (ROCEPHIN) 1 g in lidocaine 2.86 mL (tot vol) IM injection (has no administration in time range)     Clinical Impression   Cellulitis of other specified site - recheck left facial (Primary)       Disposition: Discharged

## 2023-04-14 NOTE — ED Nurses Note (Signed)
Patient here for a wound recheck. Patient was dx with cellulitis of the face on 04/13/2023. Patient was given IV antibiotics and she states it has improved and most of redness has resolved.

## 2023-04-14 NOTE — ED Nurses Note (Signed)
Patient discharged home with family.  AVS reviewed with patient/care giver.  A written copy of the AVS and discharge instructions was given to the patient/care giver. Scripts handed to patient/care giver. Questions sufficiently answered as needed.  Patient/care giver encouraged to follow up with PCP as indicated.  In the event of an emergency, patient/care giver instructed to call 911 or go to the nearest emergency room.

## 2023-04-14 NOTE — ED Triage Notes (Signed)
Patient states she is here for recheck of cellulitis of the face, was seen here yesterday. States the swelling is improved

## 2023-04-14 NOTE — Discharge Instructions (Signed)
Continue medication and follow up as discussed . Return for any concern.

## 2023-04-17 LAB — URINE CULTURE,ROUTINE: URINE CULTURE: >100000 — AB

## 2023-04-28 ENCOUNTER — Other Ambulatory Visit (HOSPITAL_COMMUNITY): Payer: Self-pay | Admitting: Cardiovascular Disease

## 2023-04-28 DIAGNOSIS — R109 Unspecified abdominal pain: Secondary | ICD-10-CM

## 2023-05-19 ENCOUNTER — Inpatient Hospital Stay
Admission: RE | Admit: 2023-05-19 | Discharge: 2023-05-19 | Disposition: A | Discharge status: Home / Self Care | Payer: 59 | Source: Ambulatory Visit | Attending: Cardiovascular Disease | Admitting: Cardiovascular Disease

## 2023-05-19 ENCOUNTER — Other Ambulatory Visit: Payer: Self-pay

## 2023-05-19 DIAGNOSIS — R109 Unspecified abdominal pain: Secondary | ICD-10-CM | POA: Insufficient documentation

## 2023-05-27 ENCOUNTER — Other Ambulatory Visit (INDEPENDENT_AMBULATORY_CARE_PROVIDER_SITE_OTHER): Payer: Self-pay

## 2023-05-27 ENCOUNTER — Encounter (INDEPENDENT_AMBULATORY_CARE_PROVIDER_SITE_OTHER): Payer: Self-pay

## 2023-05-27 DIAGNOSIS — Z789 Other specified health status: Secondary | ICD-10-CM | POA: Insufficient documentation

## 2023-05-27 DIAGNOSIS — E278 Other specified disorders of adrenal gland: Secondary | ICD-10-CM

## 2023-05-30 ENCOUNTER — Other Ambulatory Visit: Payer: Self-pay

## 2023-05-30 ENCOUNTER — Encounter (INDEPENDENT_AMBULATORY_CARE_PROVIDER_SITE_OTHER): Payer: Self-pay | Admitting: Surgery

## 2023-05-30 ENCOUNTER — Ambulatory Visit (INDEPENDENT_AMBULATORY_CARE_PROVIDER_SITE_OTHER): Payer: 59 | Admitting: Surgery

## 2023-05-30 VITALS — BP 138/95 | HR 80 | Ht 67.0 in | Wt 217.0 lb

## 2023-05-30 DIAGNOSIS — I159 Secondary hypertension, unspecified: Secondary | ICD-10-CM | POA: Insufficient documentation

## 2023-05-30 DIAGNOSIS — E278 Other specified disorders of adrenal gland: Secondary | ICD-10-CM

## 2023-05-30 NOTE — H&P (Signed)
GENERAL [facility]GERY[facility]  [address]  [place] [address]    History and Physical    Name: Laurie Eaton MRN:  [id number]   Date: 05/30/2023 DOB:  09/22/1975 (47 y.o.)           H&P    Referring Provider: No ref. provider found     Chief Complaint:  Referral (Pt ref from Dr. 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  Patient underwent a repeat CT scan of the abdomen now that showed that the left adrenal mass has increased in size from 3.2 cm to 5.2 cm now.  Patient has no known history of any malignancy.  She was recently diagnosed to have hypertension for which treatment was started when she went for medical clearance for knee surgery so the surgery has been held.    Patient denies any prior history of hypertension and claimed that she gets regular yearly physicals and blood pressure was always normal.  She is not having any chest pains or palpitations.  No cough hemoptysis or dyspnea.  She is watching her diet since the hypertension was found so she may have lost a few lb otherwise no change in weight    She does not have any cough hemoptysis or dyspnea.  No fever chills or night sweats.  No GI complaints or any abdominal pain.  She denies any flank pain she does have a history of low back pain which is central.  No swelling of the ankles    ROS  No other complaints    Past Medical History:  History reviewed. No pertinent past medical history.  Past Medical History was reviewed and is negative for except recently diagnosed hypertension          Family Medical History:       Problem Relation (Age of Onset)    Breast Cancer Mother (80), Paternal Aunt    No Known Problems Father, Sister, Brother, Maternal Grandmother, Maternal Grandfather, Paternal Grandmother, Paternal Grandfather, Daughter, Son, Maternal Aunt, Maternal Uncle, Paternal Uncle, Other             Social History     Tobacco Use     Smoking status: Never    Smokeless tobacco: Never   Vaping Use    Vaping status: Never Used   Substance Use Topics    Drug use: Never        Allergies:  Allergies   Allergen Reactions    Penicillins Hives/ Urticaria     Other Reaction(s): Hives    Penicillamine Rash        Medications:  Current Outpatient Medications   Medication Sig    ergocalciferol, vitamin D2, (DRISDOL) 1,250 mcg (50,000 unit) Oral Capsule Take 1 Capsule (50,000 Units total) by mouth Every 7 days    olmesartan-hydrochlorothiazide (BENICAR HCT) 20-12.5 mg Oral Tablet Take 1 Tablet by mouth Once a day        Physical Exam:    Objective:  Vital: BP (!) 138/95 (Site: Right Arm, Patient Position: Sitting, Cuff Size: Adult)   Pulse 80   Ht 1.702 m (5\' 7" )   Wt 98.4 kg (217 lb)   SpO2 98%   BMI 33.99 kg/m     Patient is fully awake and alert and fully oriented and in no distress.  Blood pressure is elevated.  She is overweight.  There was no obvious pallor cyanosis or jaundice.    Neck supple   Lungs are clear   Heart sounds normal  Abdomen is soft nontender without any palpable or pulsatile masses.  There was no left flank tenderness or any mass.  There was no evidence of any hernia.  There is no ankle edema or calf tenderness and pedal pulses are palpable.    CT scan of the abdomen shows a 5.2 cm indeterminate left adrenal mass.  Her laboratory studies in terms of CBC and CMP about a month ago were essentially unremarkable.  Potassium was normal except total bilirubin was 2.0    Assessment and Plan:    ICD-10-CM    1. 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  As recommended by radiologist also I will get CT scan of the abdomen with the adrenal protocol and also ordered some hormonal studies for the adrenal mass.  I do not malignancy because it has increased in size only by 2 cm over 10 years  Follow  Up:  No follow-ups on file.    Olevia Perches, MD

## 2023-05-30 NOTE — Addendum Note (Signed)
Addended by: Burnett Kanaris on: 05/30/2023 03:53 PM     Modules accepted: Orders

## 2023-06-01 NOTE — Addendum Note (Signed)
Addended by: Burnett Kanaris on: 06/01/2023 09:03 AM     Modules accepted: Orders

## 2023-06-11 ENCOUNTER — Other Ambulatory Visit: Payer: Self-pay

## 2023-06-11 ENCOUNTER — Ambulatory Visit: Payer: 59 | Attending: Surgery

## 2023-06-11 DIAGNOSIS — I159 Secondary hypertension, unspecified: Secondary | ICD-10-CM | POA: Insufficient documentation

## 2023-06-11 DIAGNOSIS — E278 Other specified disorders of adrenal gland: Secondary | ICD-10-CM | POA: Insufficient documentation

## 2023-06-15 LAB — DEHYDROEPIANDROSTERONE SULFATE (DHEA-S), SERUM: DHEA-SULFATE: 93 ug/dL (ref 19–231)

## 2023-06-16 ENCOUNTER — Other Ambulatory Visit: Payer: Self-pay

## 2023-06-16 ENCOUNTER — Inpatient Hospital Stay
Admission: RE | Admit: 2023-06-16 | Discharge: 2023-06-16 | Disposition: A | Discharge status: Home / Self Care | Payer: 59 | Source: Ambulatory Visit | Attending: Surgery

## 2023-06-16 DIAGNOSIS — E278 Other specified disorders of adrenal gland: Secondary | ICD-10-CM | POA: Insufficient documentation

## 2023-06-16 LAB — RENIN ACTIVITY, PLASMA: PRA,LC/MS/MS: 0.35 ng/mL/h (ref 0.25–5.82)

## 2023-06-16 MED ORDER — IOHEXOL 350 MG IODINE/ML INTRAVENOUS SOLUTION
100.0000 mL | INTRAVENOUS | Status: AC
Start: 2023-06-16 — End: 2023-06-16
  Administered 2023-06-16: 75 mL via INTRAVENOUS

## 2023-06-19 LAB — ALDOSTERONE/PLASMA RENIN ACTIVITY RATIO, LC/MS/MS
ALDO/PRA RATIO: 60 ratio — ABNORMAL HIGH (ref 0.9–28.9)
ALDOSTERONE: 12 ng/dL
PRA,LC/MS/MS: 0.2 ng/mL/h — ABNORMAL LOW (ref 0.25–5.82)

## 2023-06-22 ENCOUNTER — Encounter (INDEPENDENT_AMBULATORY_CARE_PROVIDER_SITE_OTHER): Payer: Self-pay | Admitting: Surgery

## 2023-06-22 ENCOUNTER — Other Ambulatory Visit: Payer: Self-pay

## 2023-06-22 ENCOUNTER — Ambulatory Visit (INDEPENDENT_AMBULATORY_CARE_PROVIDER_SITE_OTHER): Payer: 59 | Admitting: Surgery

## 2023-06-22 VITALS — BP 130/90 | HR 72 | Ht 67.0 in | Wt 216.0 lb

## 2023-06-22 DIAGNOSIS — I723 Aneurysm of iliac artery: Secondary | ICD-10-CM

## 2023-06-22 DIAGNOSIS — E278 Other specified disorders of adrenal gland: Secondary | ICD-10-CM

## 2023-06-22 NOTE — Addendum Note (Signed)
Addended by: Burnett Kanaris on: 06/22/2023 11:28 AM     Modules accepted: Orders

## 2023-06-22 NOTE — Progress Notes (Signed)
[facility]AL [facility]GERY[facility]  [address]  [place] [address]  Phone: [phone]  Fax: [phone]    Encounter Date: 06/22/2023    Patient ID:  Laurie Eaton  [id number]    DOB: 1976/02/01  Age: 47 y.o. female    Subjective:     Chief Complaint   Patient presents with    Follow-up After Testing     CT abd w/wo contrast 06/16/2023     HPI:   This is a follow-up visit.  Patient has no new complaints.    CT scan of the abdomen and pelvis with a renal protocol shows a nonenhancing water density 5 cm lesion in the left suprarenal fossa reported as most likely a benign cyst or pseudocyst.  I reviewed the images myself and with the radiologist and has a Hounsfield unit number of 22.  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    Her potassium level has been normal  DHEA 93 which is normal  Aldosterone level 12 which is normal  Elbow sterile/PRA is 60 which is high  PRA 0.2 which is low        Current Outpatient Medications   Medication Sig    ergocalciferol, vitamin D2, (DRISDOL) 1,250 mcg (50,000 unit) Oral Capsule Take 1 Capsule (50,000 Units total) by mouth Every 7 days    olmesartan-hydrochlorothiazide (BENICAR HCT) 20-12.5 mg Oral Tablet Take 1 Tablet by mouth Once a day     Allergies   Allergen Reactions    Penicillins Hives/ Urticaria     Other Reaction(s): Hives    Penicillamine Rash     History reviewed. No pertinent past medical history.            Family Medical History:       Problem Relation (Age of Onset)    Breast Cancer Mother (28), Paternal Aunt    No Known Problems Father, Sister, Brother, Maternal Grandmother, Maternal Grandfather, Paternal Grandmother, Paternal Grandfather, Daughter, Son, Maternal Aunt, Maternal  Uncle, Paternal Uncle, Other            Social History     Tobacco Use    Smoking status: Never    Smokeless tobacco: Never   Vaping Use    Vaping status: Never Used   Substance Use Topics    Drug use: Never       ROS:   No new complaints  Objective:   Vitals: BP (!) 130/90 (Site: Right Arm, Patient Position: Sitting, Cuff Size: Adult)   Pulse 72   Ht 1.702 m (5\' 7" )   Wt 98 kg (216 lb)   SpO2 94%   BMI 33.83 kg/m         Physical Exam:  Patient is awake and alert and fully oriented and in no distress.  Blood pressure is 130/90 today  Abdomen is soft and benign  Both femoral arteries pulses are palpable as well as pedal pulses.  There is no ankle edema or calf tenderness.      Previous records all the laboratory studies as well as CT images reviewed and discussed with the radiologist.    Assessment & Plan:     ENCOUNTER DIAGNOSES     ICD-10-CM  1. 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    We will get opinion from [facility] vascular surgery to see whether this needs stenting.  Patient would be called after above test and also will be given a 6 months follow-up appointment.  Unless needed to be sooner    Orders Placed This Encounter    Catecholamines, Fract, 24 Hour Urine W/O Creat    VANILLYLMANDELIC ACID (VMA), 24 HOUR, URINE       No follow-ups on file.    Olevia Perches, MD

## 2023-06-25 LAB — DEXAMETHASONE, SERUM: DEXAMETHASONE: <20 ng/dL

## 2023-07-01 ENCOUNTER — Other Ambulatory Visit: Payer: 59 | Attending: Surgery

## 2023-07-01 ENCOUNTER — Other Ambulatory Visit: Payer: Self-pay

## 2023-07-01 DIAGNOSIS — E278 Other specified disorders of adrenal gland: Secondary | ICD-10-CM | POA: Insufficient documentation

## 2023-07-07 LAB — CATECHOLAMINES, FRACT, 24 HOUR URINE W/O CREAT
24 HR. URINE VOLUME: 1000 mL/(24.h)
CALCULATED TOTAL (E+NE): 45 ug/(24.h) (ref 26–121)
DOPAMINE, 24 HR URINE: 469 ug/(24.h) (ref 52–480)
EPINEPHRINE, 24 HR URINE: 8 ug/(24.h) (ref 2–24)
NOREPINEPHRINE, 24 HR UR: 37 ug/(24.h) (ref 15–100)

## 2023-07-10 LAB — VANILLYLMANDELIC ACID (VMA), 24 HOUR, URINE
TOTAL VOLUME: 1000 mL
VMA, 24 HR URINE: 6.1 mg/(24.h) — ABNORMAL HIGH (ref <=6.0)

## 2023-07-15 ENCOUNTER — Other Ambulatory Visit (INDEPENDENT_AMBULATORY_CARE_PROVIDER_SITE_OTHER): Payer: Self-pay

## 2023-07-18 ENCOUNTER — Other Ambulatory Visit (INDEPENDENT_AMBULATORY_CARE_PROVIDER_SITE_OTHER): Payer: Self-pay | Admitting: Surgery

## 2023-07-18 DIAGNOSIS — E278 Other specified disorders of adrenal gland: Secondary | ICD-10-CM

## 2023-07-29 ENCOUNTER — Inpatient Hospital Stay (INDEPENDENT_AMBULATORY_CARE_PROVIDER_SITE_OTHER)
Admission: RE | Admit: 2023-07-29 | Discharge: 2023-07-29 | Disposition: A | Discharge status: Home / Self Care | Payer: 59 | Source: Ambulatory Visit

## 2023-07-29 ENCOUNTER — Other Ambulatory Visit: Payer: Self-pay

## 2023-07-29 DIAGNOSIS — I723 Aneurysm of iliac artery: Secondary | ICD-10-CM

## 2023-07-29 DIAGNOSIS — E278 Other specified disorders of adrenal gland: Secondary | ICD-10-CM

## 2023-08-03 ENCOUNTER — Ambulatory Visit (HOSPITAL_COMMUNITY): Payer: Self-pay

## 2023-08-15 ENCOUNTER — Telehealth (HOSPITAL_COMMUNITY): Payer: Self-pay | Admitting: Vascular & Interventional Radiology

## 2023-08-15 NOTE — OR PreOp (Signed)
RN spoke with patient regarding upcoming procedure. Patient stated she wanted to reschedule procedure because her daughter was being induced into labor on 2/11. RN notified CT Penni Homans and directed patient to contact Dr I Rana's office to reschedule.

## 2023-08-16 ENCOUNTER — Ambulatory Visit (HOSPITAL_COMMUNITY): Payer: Self-pay

## 2023-08-26 ENCOUNTER — Encounter (INDEPENDENT_AMBULATORY_CARE_PROVIDER_SITE_OTHER): Payer: Self-pay | Admitting: Surgery

## 2023-08-26 ENCOUNTER — Other Ambulatory Visit: Payer: Self-pay

## 2023-08-26 ENCOUNTER — Ambulatory Visit (INDEPENDENT_AMBULATORY_CARE_PROVIDER_SITE_OTHER): Payer: 59 | Admitting: Surgery

## 2023-08-26 ENCOUNTER — Ambulatory Visit (INDEPENDENT_AMBULATORY_CARE_PROVIDER_SITE_OTHER): Payer: Self-pay | Admitting: Surgery

## 2023-08-26 DIAGNOSIS — E278 Other specified disorders of adrenal gland: Secondary | ICD-10-CM

## 2023-08-26 NOTE — Progress Notes (Unsigned)
 GENERAL SURGERY[facility]  [address]  [place] [address]  Phone: [phone]  Fax: [phone]    Encounter Date: 08/26/2023    Patient ID:  Laurie Eaton  [id number]:[id number]    DOB: 1975/11/17  Age: 48 y.o. female    Subjective:   No chief complaint on file.    HPI:   Patient has left adrenal mass for which hormone studies were done which were all essentially normal.  CT scan was done with the adrenal protocol where it was felt that most likely it is benign and it was mentioned it could be cyst for that matter I ordered a sonogram but apparently because of her body habitus it could not be seen on the sonogram.    Since it has increased in size from the previous examination CT-guided biopsy was ordered after discussing the with the patient however she did not get it done partly because of the weather related problems and she came today to discuss with me and was questioning whether she should have the biopsy or not.    She has no symptoms related to it either in the term of abdominal pain or back pain or any other complaints.  I have told her that because the hormone levels normal her blood pressure is not related to this tumor in my opinion.    Current Outpatient Medications   Medication Sig    ergocalciferol, vitamin D2, (DRISDOL) 1,250 mcg (50,000 unit) Oral Capsule Take 1 Capsule (50,000 Units total) by mouth Every 7 days    olmesartan-hydrochlorothiazide (BENICAR HCT) 20-12.5 mg Oral Tablet Take 1 Tablet by mouth Once a day     Allergies   Allergen Reactions    Penicillins Hives/ Urticaria     Other Reaction(s): Hives    Penicillamine Rash     No past medical history on file.  Past Medical History was reviewed and is negative for any other medical problems except hypertension          Family Medical History:       Problem Relation (Age of Onset)    Breast Cancer Mother (6), Paternal Aunt    No Known Problems Father, Sister, Brother, Maternal Grandmother, Maternal Grandfather, Paternal  Grandmother, Paternal Grandfather, Daughter, Son, Maternal Aunt, Maternal Uncle, Paternal Uncle, Other            Social History     Tobacco Use    Smoking status: Never    Smokeless tobacco: Never   Vaping Use    Vaping status: Never Used   Substance Use Topics    Drug use: Never       ROS:   No other complaints  Objective:   Vitals: There were no vitals taken for this visit.        Physical Exam:  Patient is fully awake and alert and fully oriented and in no distress.  No new findings noted.    Previous records were reviewed.    Assessment & Plan:     ENCOUNTER DIAGNOSES     ICD-10-CM   1. 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  Patient understood and agrees and is being rescheduled for CT-guided biopsy.  She will also discuss with the interventional radiologist regarding the details of  the procedure.    No orders of the defined types were placed in this encounter.      No follow-ups on file.    Olevia Perches, MD

## 2023-09-02 ENCOUNTER — Encounter (INDEPENDENT_AMBULATORY_CARE_PROVIDER_SITE_OTHER): Payer: 59 | Admitting: Surgery

## 2023-11-08 ENCOUNTER — Other Ambulatory Visit (HOSPITAL_COMMUNITY): Payer: Self-pay | Admitting: NURSE PRACTITIONER

## 2023-11-08 DIAGNOSIS — Z1231 Encounter for screening mammogram for malignant neoplasm of breast: Secondary | ICD-10-CM

## 2023-11-22 ENCOUNTER — Ambulatory Visit
Admission: RE | Admit: 2023-11-22 | Discharge: 2023-11-22 | Disposition: A | Discharge status: Home / Self Care | Payer: Self-pay | Source: Ambulatory Visit | Attending: NURSE PRACTITIONER | Admitting: NURSE PRACTITIONER

## 2023-11-22 ENCOUNTER — Other Ambulatory Visit: Payer: Self-pay

## 2023-11-22 ENCOUNTER — Encounter (HOSPITAL_COMMUNITY): Payer: Self-pay

## 2023-11-22 DIAGNOSIS — Z1231 Encounter for screening mammogram for malignant neoplasm of breast: Secondary | ICD-10-CM | POA: Insufficient documentation

## 2023-12-19 ENCOUNTER — Other Ambulatory Visit: Payer: Self-pay

## 2023-12-21 ENCOUNTER — Encounter (INDEPENDENT_AMBULATORY_CARE_PROVIDER_SITE_OTHER): Payer: Self-pay | Admitting: Surgery

## 2024-01-02 ENCOUNTER — Ambulatory Visit (HOSPITAL_COMMUNITY): Payer: Self-pay

## 2024-01-09 ENCOUNTER — Ambulatory Visit (HOSPITAL_COMMUNITY): Payer: Self-pay

## 2024-01-13 ENCOUNTER — Encounter (INDEPENDENT_AMBULATORY_CARE_PROVIDER_SITE_OTHER): Admitting: Surgery

## 2024-02-09 ENCOUNTER — Encounter (HOSPITAL_COMMUNITY): Payer: Self-pay | Admitting: NURSE PRACTITIONER

## 2024-02-10 ENCOUNTER — Other Ambulatory Visit (HOSPITAL_COMMUNITY): Payer: Self-pay | Admitting: NURSE PRACTITIONER

## 2024-02-10 ENCOUNTER — Encounter (HOSPITAL_COMMUNITY): Payer: Self-pay

## 2024-02-10 DIAGNOSIS — I723 Aneurysm of iliac artery: Secondary | ICD-10-CM

## 2024-03-23 ENCOUNTER — Other Ambulatory Visit: Payer: Self-pay

## 2024-03-23 ENCOUNTER — Ambulatory Visit
Admission: RE | Admit: 2024-03-23 | Discharge: 2024-03-23 | Disposition: A | Discharge status: Home / Self Care | Source: Ambulatory Visit | Attending: NURSE PRACTITIONER

## 2024-03-23 DIAGNOSIS — I723 Aneurysm of iliac artery: Secondary | ICD-10-CM | POA: Insufficient documentation

## 2024-03-23 MED ORDER — IOPAMIDOL 370 MG IODINE/ML (76 %) INTRAVENOUS SOLUTION
80.0000 mL | INTRAVENOUS | Status: AC
Start: 2024-03-23 — End: 2024-03-23
  Administered 2024-03-23: 80 mL via INTRAVENOUS

## 2024-05-26 ENCOUNTER — Other Ambulatory Visit: Payer: Self-pay

## 2024-05-26 NOTE — ED Triage Notes (Addendum)
 Tested positive for COVID at [facility] on Friday. Started experiencing upper left back pain today that has gotten progressively worse throughout the day.

## 2024-05-27 ENCOUNTER — Emergency Department (HOSPITAL_COMMUNITY)

## 2024-05-27 ENCOUNTER — Emergency Department
Admission: EM | Admit: 2024-05-27 | Discharge: 2024-05-27 | Disposition: A | Attending: NURSE PRACTITIONER | Admitting: NURSE PRACTITIONER

## 2024-05-27 DIAGNOSIS — M549 Dorsalgia, unspecified: Secondary | ICD-10-CM

## 2024-05-27 DIAGNOSIS — Z79899 Other long term (current) drug therapy: Secondary | ICD-10-CM | POA: Insufficient documentation

## 2024-05-27 DIAGNOSIS — U071 COVID-19: Secondary | ICD-10-CM

## 2024-05-27 DIAGNOSIS — M546 Pain in thoracic spine: Secondary | ICD-10-CM | POA: Insufficient documentation

## 2024-05-27 MED ORDER — IBUPROFEN 800 MG TABLET
ORAL_TABLET | ORAL | Status: AC
Start: 2024-05-27 — End: 2024-05-27
  Filled 2024-05-27: qty 1

## 2024-05-27 MED ORDER — IBUPROFEN 800 MG TABLET
800.0000 mg | ORAL_TABLET | ORAL | Status: AC
Start: 2024-05-27 — End: 2024-05-27
  Administered 2024-05-27: 800 mg via ORAL

## 2024-05-27 NOTE — ED Provider Notes (Signed)
  Medicine [facility]  ED Primary Provider Note        Arrival: The patient arrived by Car     Consent for AI Scribe software [employer] discussed/obtained verbally for encounter with patient    History of Present Illness   chief complaint  Laurie Eaton is a 48 y.o. female who had concerns including Covid-19 Positive and Back Pain.     History of Present Illness  Laurie Eaton is a 48 year old female who presents with back pain and a recent COVID-19 diagnosis.    She began experiencing significant malaise, weakness, and congestion on Thursday and Friday, leading her to seek care at [facility] on Friday, where she was diagnosed with COVID-19. By Saturday, she felt slightly better and engaged in activities like cleaning, but subsequently developed back pain. The pain has persisted and worsened, waking her from sleep.    She describes the back pain as a 'sharp ache' and rates it as 7.5 out of 10, located on one side. She denies significant coughing but reports congestion and drainage.    She experienced fever and chills, which improved with Tylenol. She also reports diarrhea today. No nausea or vomiting. No urinary symptoms such as dysuria or hematuria.    She has not taken ibuprofen  today or yesterday but did take some on Friday. She is on blood pressure medication and is cautious about ibuprofen  use.    Her last menstrual period was at the beginning of the month, and she notes it has been erratic, suspecting perimenopause. She confirms she is not pregnant.    She does not smoke or vape.    PERC negative    Review of Systems     No other overt Review of Systems are noted to be positive except noted in the HPI.    Historical Data   History Reviewed This Encounter:     Past Medical/Surgical/Social History  No past medical history on file.    Social History     Socioeconomic History    Marital status: Single   Tobacco Use    Smoking status: Never    Smokeless tobacco: Never   Vaping Use    Vaping  status: Never Used   Substance and Sexual Activity    Drug use: Never    Sexual activity: Not Currently       Allergies  Allergies[1]        PHYSICAL EXAM  VITAL SIGNS:  Filed Vitals:    05/27/24 0002   BP: 107/72   Pulse: 86   Resp: 20   Temp: 36.4 C (97.6 F)   SpO2: 98%     Constitutional: Awake. Average body weight. Generally healthy appearing. No distress noted.   HEENT:   Head/Face: Normocephalic and atraumatic.   Mouth/Throat: Oropharynx pink and moist. No postnasal drainage, erythema or exudates.   Eyes: EOMI, PERRL   Ears: External ear normal, no drainage noted; pearly gray TM, bilaterally. No postauricular tenderness, erythema, swelling or mass  Nose:External nose normal, no drainage  Cardiovascular: Regular rate. No murmur or gallop heard.   Pulmonary/Chest: Breath sounds clear and equal bilaterally. No chest tenderness. No respiratory distress.   Abdominal: Bowel sound normal. Abdomen soft, no tenderness, rebound or guarding.    Musculoskeletal: No tenderness or deformity. Normal muscle tone and strength.   Skin: warm and dry. No rash, redness, or bruising  Psychiatric: normal mood and affect. Behavior is normal.   Neurological: Alert, oriented. Normal gait.  No focal weakness noted. No sensory deficit. No speech disturbances       Diagnostics    Labs  No results found for any visits on 05/27/24.    Radiology  Results for orders placed or performed during the hospital encounter of 05/27/24   XR CHEST PA AND LATERAL     Status: None    Narrative    Mahathi S Mcgann    CLINICAL HISTORY:  Age: 50 years  History: COVID positive. L side back pain    TECHNIQUE: Frontal and lateral chest radiograph.    COMPARISON: 06/02/2016    FINDINGS:      The lungs are clear. There is no pleural effusion or pneumothorax. Heart size is normal.     The imaged bones are unremarkable.      Impression    No acute cardiopulmonary disease.        Radiologist location ID: [id number]         Results  RADIOLOGY  Chest x-ray: No acute  cardiopulmonary findings (05/26/2024)    Medical Decision Making     Medical Decision Making  48 year old female with recent COVID-19 diagnosis presented with acute onset sharp back pain that woke her from sleep, ongoing mild upper respiratory symptoms, and new diarrhea. She reported prior fever and body aches, which have improved with acetaminophen, and denied significant cough, urinary symptoms, or rash. Exam revealed localized back pain with tenderness on deep inspiration, clear lungs, and no skin abnormalities. Chest x-ray showed no acute cardiopulmonary findings. Her back pain improved after administration of ibuprofen  in the ED.    Differential diagnosis includes, but is not limited to:  - Musculoskeletal back pain: Acute sharp back pain without trauma, cough, or urinary symptoms, and with recent physical activity, is most consistent with musculoskeletal etiology.  - Pneumonia or pulmonary process: Pulmonary causes such as pneumonia were considered due to recent COVID-19 and back pain with inspiration, but chest x-ray showed no acute cardiopulmonary findings, making this less likely. PERC negative     Acute back pain (musculoskeletal vs. pulmonary etiology)  - Administered ibuprofen  for pain management.    Medical Decision Making  Amount and/or Complexity of Data Reviewed  Radiology: ordered. Decision-making details documented in ED Course.    Risk  Prescription drug management.        ED Course  ED Course as of 05/27/24 0212   [place] May 27, 2024   0212 XR CHEST PA AND LATERAL  FINDINGS:        The lungs are clear. There is no pleural effusion or pneumothorax. Heart size is normal.      The imaged bones are unremarkable.     IMPRESSION:  No acute cardiopulmonary disease.            Medications Ordered/Administered in the ED   ibuprofen  (MOTRIN ) tablet (has no administration in time range)       Clinical Impression   COVID (Primary)   Acute back pain         Following the history, physical exam, and ED workup,  the patient was deemed stable and suitable for discharge. The patient/caregiver was advised to return to the ED for any new or worsening symptoms. Discharge medications, and follow-up instructions were discussed with the patient/caregiver in detail, who verbalizes understanding. The patient/caregiver is in agreement and is comfortable with the plan of care.    Disposition: Discharged         Current Discharge Medication List  CONTINUE these medications - NO CHANGES were made during your visit.        Details   ergocalciferol (vitamin D2) 1,250 mcg (50,000 unit) Capsule  Commonly known as: DRISDOL   50,000 Units, EVERY 7 DAYS  Refills: 0     olmesartan-hydrochlorothiazide 20-12.5 mg Tablet  Commonly known as: BENICAR HCT   1 Tablet, Daily  Refills: 0            Follow up:   No follow-up provider specified.          Cris Gibby K. Shakelia Scrivner- APRN, FNP-C, ENP-C  [facility]  Department of Emergency Medicine             [1]   Allergies  Allergen Reactions    Penicillins Hives/ Urticaria     Other Reaction(s): Hives    Penicillamine Rash

## 2024-05-27 NOTE — ED Nurses Note (Signed)
 Discharge instructions reviewed with patient and all questions answered. Patient verbalizes understanding of discharge instructions. Patient out of ER via ambulation at this time.

## 2024-05-27 NOTE — Discharge Instructions (Signed)
 Cover your cough and sneezes to prevent spread of infection. Consider purchasing a pulse oximeter to monitor oxygen at home.  If oxygen drops below 90% on room air, return to ER. Start taking over-the-counter vitamin-C.  Use over-the-counter cold and cough medications as needed. Stay hydrated with plenty of fluids.  Quit smoking If needed.  Follow-up with your doctor within 1 week.     Please take Ibuprofen , Naproxen  or Tylenol  for pain. These are available over the counter.   You may take Ibuprofen  600 mg every 8 hours with food for pain. Alternatively, you may take Naproxen  250-500 mg every 12 hours with food for pain.  You may also take Tylenol 650mg  every 4-6 hours as needed for pain.   Evidence based research has shown taking Ibuprofen  or Naproxen  & Tylenol together work the same for pain than opioids or narcotic pain medications.
